# Patient Record
Sex: Female | Born: 1970 | Race: White | Hispanic: No | Marital: Married | Smoking: Never smoker
Health system: Southern US, Community
[De-identification: ages and names within clinical notes are randomized; demographics above are authoritative.]

## PROBLEM LIST (undated history)

## (undated) DIAGNOSIS — G43909 Migraine, unspecified, not intractable, without status migrainosus: Secondary | ICD-10-CM

## (undated) DIAGNOSIS — E119 Type 2 diabetes mellitus without complications: Secondary | ICD-10-CM

## (undated) DIAGNOSIS — E039 Hypothyroidism, unspecified: Secondary | ICD-10-CM

## (undated) DIAGNOSIS — E049 Nontoxic goiter, unspecified: Secondary | ICD-10-CM

## (undated) HISTORY — DX: Migraine, unspecified, not intractable, without status migrainosus: G43.909

## (undated) HISTORY — DX: Nontoxic goiter, unspecified: E04.9

## (undated) HISTORY — DX: Type 2 diabetes mellitus without complications: E11.9

---
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
## 1998-11-02 ENCOUNTER — Other Ambulatory Visit: Admission: RE | Admit: 1998-11-02 | Discharge: 1998-11-02 | Payer: Self-pay | Admitting: Obstetrics and Gynecology

## 1999-12-05 ENCOUNTER — Other Ambulatory Visit: Admission: RE | Admit: 1999-12-05 | Discharge: 1999-12-05 | Payer: Self-pay | Admitting: Obstetrics and Gynecology

## 2001-01-28 ENCOUNTER — Other Ambulatory Visit: Admission: RE | Admit: 2001-01-28 | Discharge: 2001-01-28 | Payer: Self-pay | Admitting: Obstetrics and Gynecology

## 2001-02-08 ENCOUNTER — Encounter: Payer: Self-pay | Admitting: Endocrinology

## 2001-02-08 ENCOUNTER — Encounter: Admission: RE | Admit: 2001-02-08 | Discharge: 2001-02-08 | Payer: Self-pay | Admitting: Endocrinology

## 2002-03-14 ENCOUNTER — Other Ambulatory Visit: Admission: RE | Admit: 2002-03-14 | Discharge: 2002-03-14 | Payer: Self-pay | Admitting: Obstetrics and Gynecology

## 2002-06-27 ENCOUNTER — Encounter: Admission: RE | Admit: 2002-06-27 | Discharge: 2002-06-27 | Payer: Self-pay | Admitting: Internal Medicine

## 2002-06-27 ENCOUNTER — Encounter: Payer: Self-pay | Admitting: Internal Medicine

## 2006-04-06 ENCOUNTER — Encounter: Admission: RE | Admit: 2006-04-06 | Discharge: 2006-04-06 | Payer: Self-pay | Admitting: Internal Medicine

## 2007-01-20 ENCOUNTER — Encounter: Admission: RE | Admit: 2007-01-20 | Discharge: 2007-01-20 | Payer: Self-pay | Admitting: Obstetrics and Gynecology

## 2008-03-25 IMAGING — US US SOFT TISSUE HEAD/NECK
1 series · 13 of 25 positions shown · non-contrast
Comparison: 06/27/02.

CLINICAL DATA: Hypothyroidism and goiter.  Enlarged thyroid gland based on physical examination.  
THYROID ULTRASOUND:
TECHNIQUE: Ultrasound examination of the thyroid gland and adjacent soft tissue structures was performed.

[Series 1: us soft tissue head/neck · 0.08mm/px · 13 of 39 slices shown]
[im 1/39]
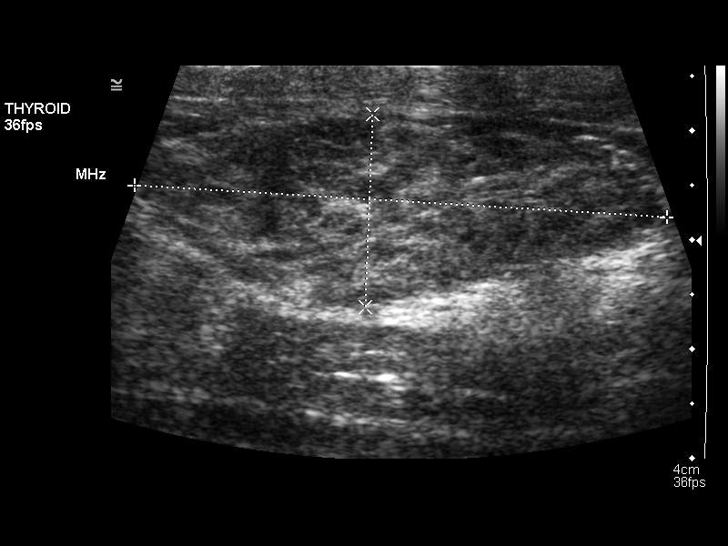
[im 4/39]
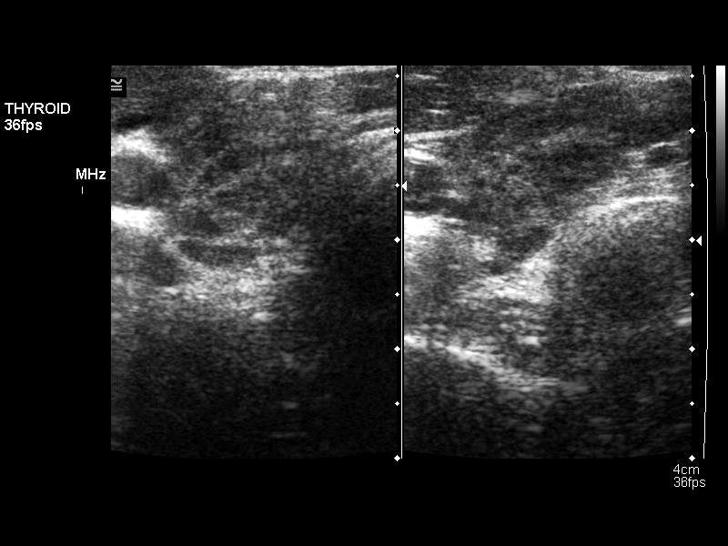
[im 7/39]
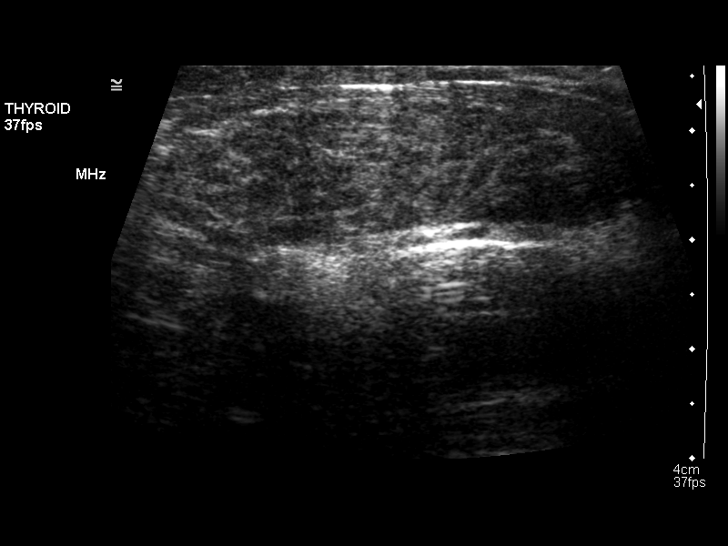
[im 10/39]
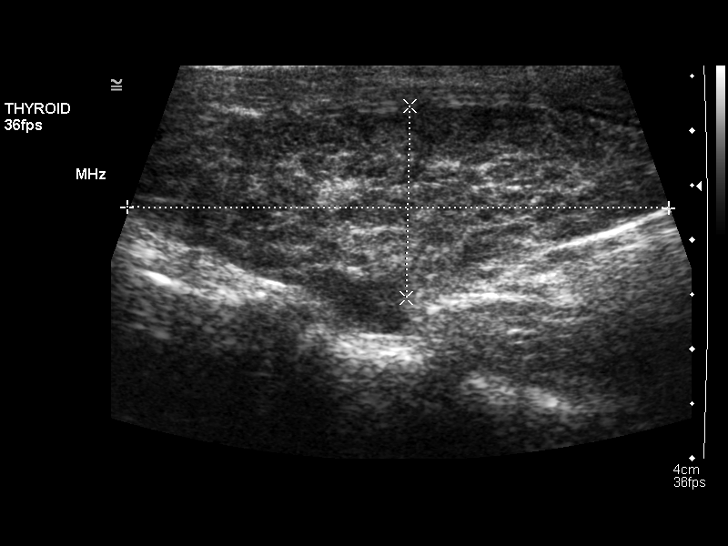
[im 13/39]
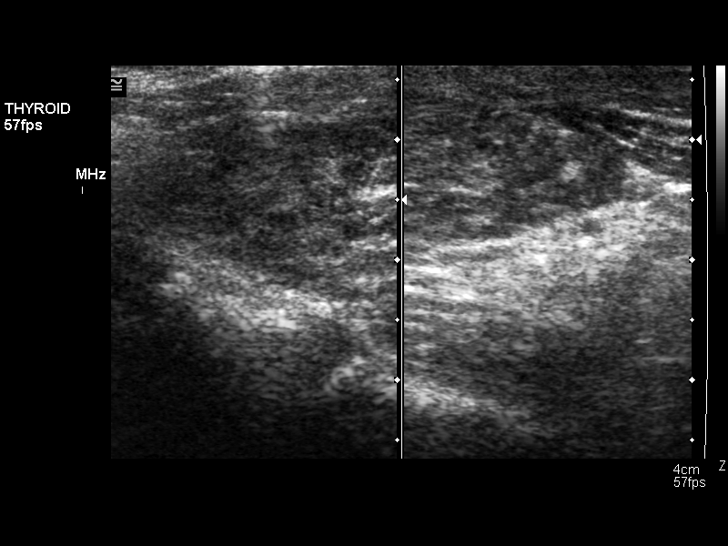
[im 16/39]
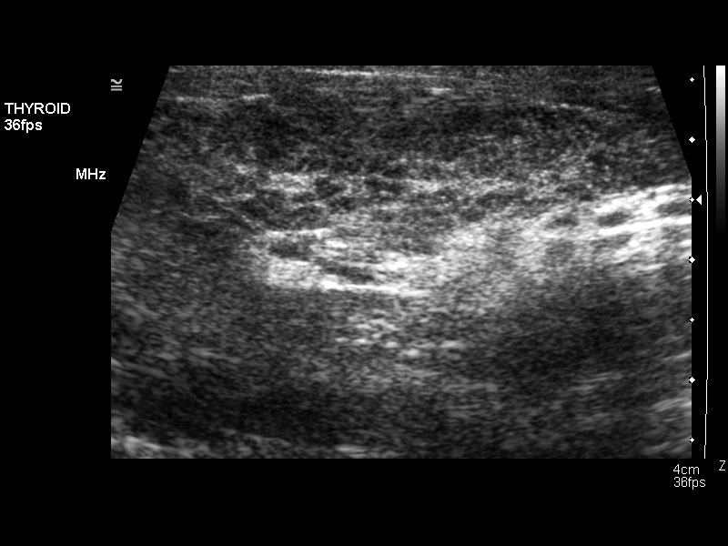
[im 20/39]
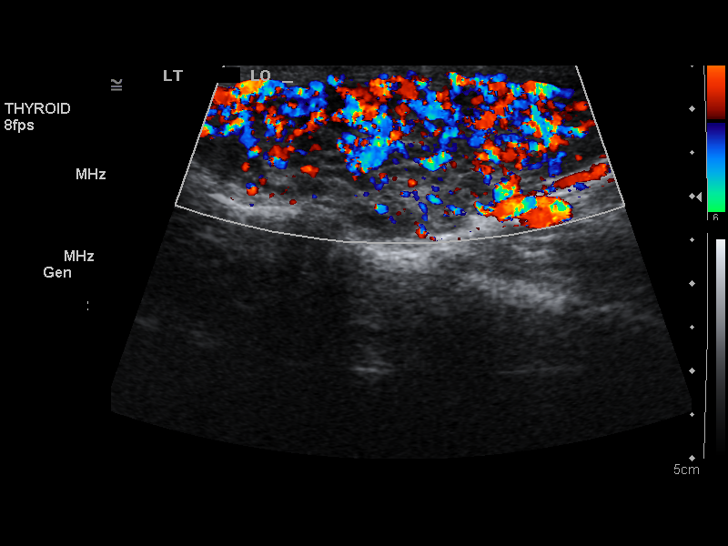
[im 23/39]
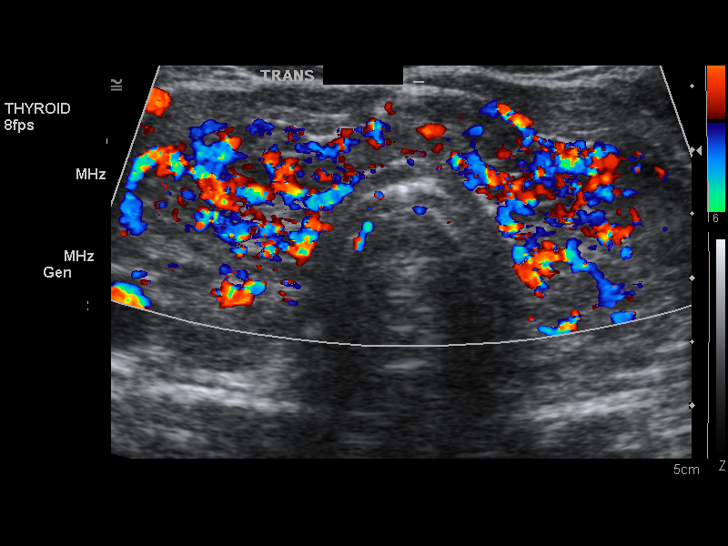
[im 26/39]
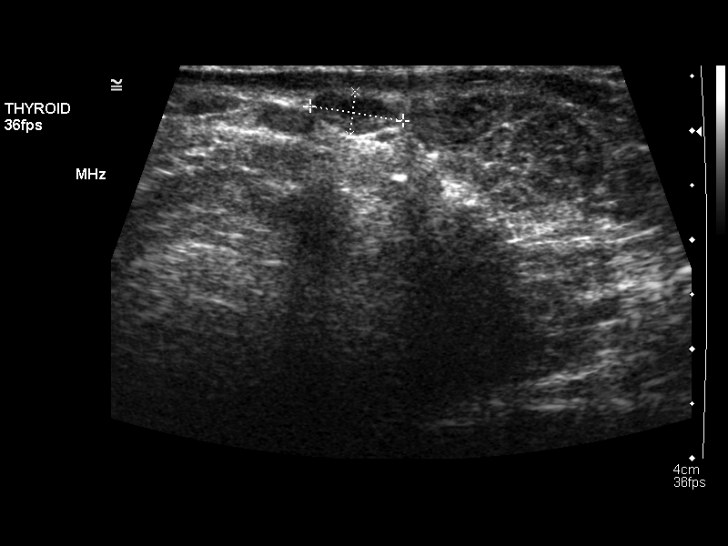
[im 29/39]
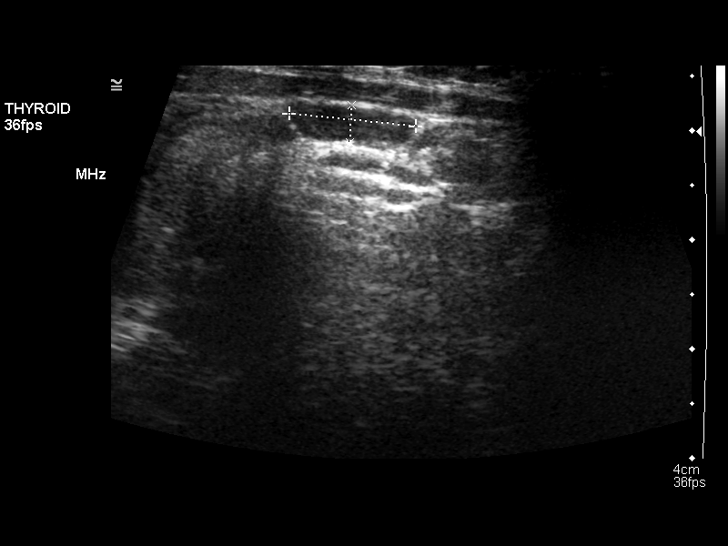
[im 32/39]
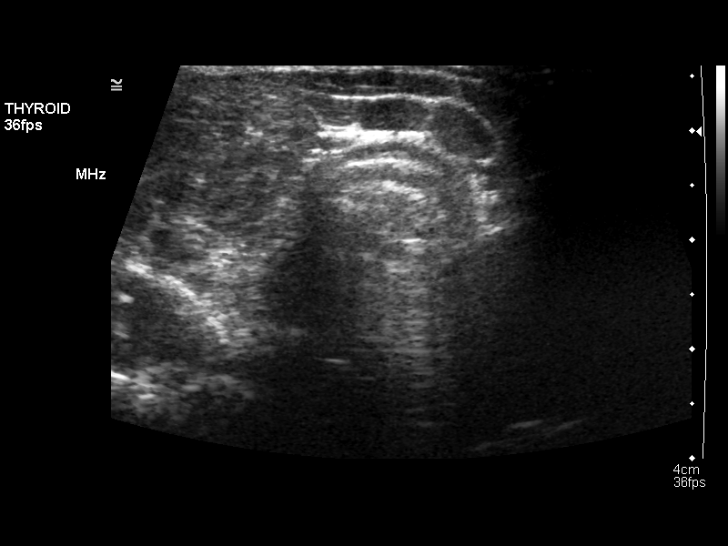
[im 35/39]
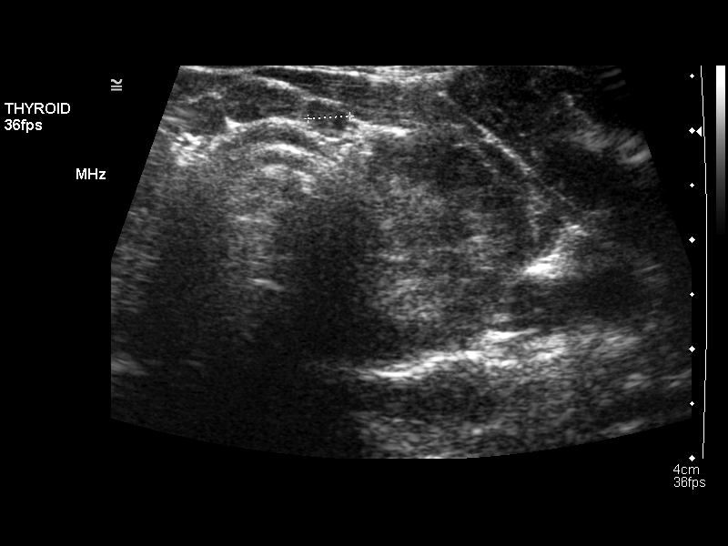
[im 39/39]
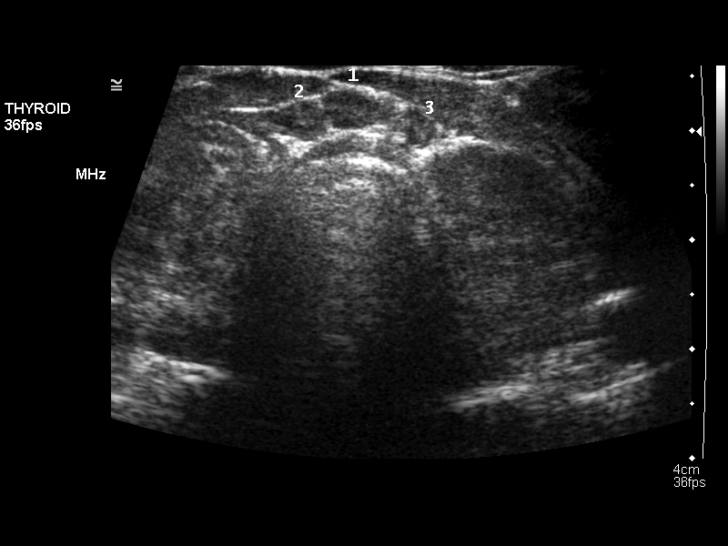

[13 of 25 positions shown; findings below may reference images not displayed]

FINDINGS: Both thyroid glands have a heterogeneous echogenicity pattern.  This morphology is similar to the prior examination.  The right gland measures 4.9 x 1.8 x 1.8cm.  Increased vascular flow throughout the right gland is similar to the prior examination.  The left thyroid gland measures 5.3 x 1.8 x 1.4cm.  The isthmus roughly measures .7cm in thickness.  Diffusely increased vascularity throughout the left thyroid gland is also similar to the prior examination.  Just above the isthmus, there are at least three small hypoechoic oval shaped structures.  The largest measures up to 9mm.  These could represent small lymph nodes but the etiology is uncertain.  Please note that one of these structures may measure up to 1.2cm.  Within the left lobe there are a few areas of increased echogenicity.  These do not confidently represent thyroid nodules and may be related to the heterogeneous appearance of the gland.
IMPRESSION: 1.  Stable appearance of the thyroid tissue with diffuse heterogeneity and increased blood flow.  Findings consistent with underlying thyroiditis.  No dominant nodules or lesions within either lobe. 
2.  At least three oval shaped structures separate from the thyroid gland just above the isthmus.  Findings most consistent with small lymph nodes that may be reactive in nature.

## 2008-05-10 ENCOUNTER — Encounter: Admission: RE | Admit: 2008-05-10 | Discharge: 2008-05-10 | Payer: Self-pay | Admitting: Obstetrics and Gynecology

## 2008-07-24 ENCOUNTER — Encounter (INDEPENDENT_AMBULATORY_CARE_PROVIDER_SITE_OTHER): Payer: Self-pay | Admitting: Obstetrics and Gynecology

## 2008-07-24 ENCOUNTER — Inpatient Hospital Stay (HOSPITAL_COMMUNITY): Admission: RE | Admit: 2008-07-24 | Discharge: 2008-07-27 | Payer: Self-pay | Admitting: Obstetrics and Gynecology

## 2008-08-02 ENCOUNTER — Ambulatory Visit: Admission: RE | Admit: 2008-08-02 | Discharge: 2008-08-02 | Payer: Self-pay | Admitting: Obstetrics and Gynecology

## 2010-01-27 ENCOUNTER — Encounter: Payer: Self-pay | Admitting: Obstetrics and Gynecology

## 2010-03-05 LAB — GC/CHLAMYDIA PROBE AMP, GENITAL: Chlamydia: NEGATIVE

## 2010-03-05 LAB — RUBELLA ANTIBODY, IGM: Rubella: IMMUNE

## 2010-03-05 LAB — ANTIBODY SCREEN: Antibody Screen: NEGATIVE

## 2010-03-05 LAB — HEPATITIS B SURFACE ANTIGEN: Hepatitis B Surface Ag: NEGATIVE

## 2010-04-14 LAB — COMPREHENSIVE METABOLIC PANEL
Albumin: 2 g/dL — ABNORMAL LOW (ref 3.5–5.2)
BUN: 3 mg/dL — ABNORMAL LOW (ref 6–23)
Creatinine, Ser: 0.64 mg/dL (ref 0.4–1.2)
Glucose, Bld: 94 mg/dL (ref 70–99)
Total Bilirubin: 0.3 mg/dL (ref 0.3–1.2)
Total Protein: 4.1 g/dL — ABNORMAL LOW (ref 6.0–8.3)

## 2010-04-14 LAB — CBC
HCT: 29 % — ABNORMAL LOW (ref 36.0–46.0)
HCT: 32.2 % — ABNORMAL LOW (ref 36.0–46.0)
HCT: 38.5 % (ref 36.0–46.0)
Hemoglobin: 11.1 g/dL — ABNORMAL LOW (ref 12.0–15.0)
Hemoglobin: 13.3 g/dL (ref 12.0–15.0)
MCHC: 34.5 g/dL (ref 30.0–36.0)
MCHC: 34.6 g/dL (ref 30.0–36.0)
MCV: 93 fL (ref 78.0–100.0)
MCV: 95 fL (ref 78.0–100.0)
MCV: 96.4 fL (ref 78.0–100.0)
Platelets: 227 10*3/uL (ref 150–400)
Platelets: 242 10*3/uL (ref 150–400)
RBC: 4.05 MIL/uL (ref 3.87–5.11)
RDW: 13.9 % (ref 11.5–15.5)
RDW: 14.1 % (ref 11.5–15.5)
RDW: 14.4 % (ref 11.5–15.5)
WBC: 12 10*3/uL — ABNORMAL HIGH (ref 4.0–10.5)

## 2010-04-14 LAB — DIFFERENTIAL
Band Neutrophils: 0 % (ref 0–10)
Basophils Relative: 0 % (ref 0–1)
Blasts: 0 %
Lymphocytes Relative: 11 % — ABNORMAL LOW (ref 12–46)
Lymphs Abs: 1.5 10*3/uL (ref 0.7–4.0)
Monocytes Absolute: 0.7 10*3/uL (ref 0.1–1.0)
Monocytes Relative: 5 % (ref 3–12)
Neutro Abs: 10.9 10*3/uL — ABNORMAL HIGH (ref 1.7–7.7)
Neutrophils Relative %: 82 % — ABNORMAL HIGH (ref 43–77)

## 2010-04-14 LAB — URIC ACID: Uric Acid, Serum: 3.1 mg/dL (ref 2.4–7.0)

## 2010-04-14 LAB — RPR: RPR Ser Ql: NONREACTIVE

## 2010-05-21 NOTE — Op Note (Signed)
NAME:  Monica Nichols, Monica Nichols                  ACCOUNT NO.:  [id number]   MEDICAL RECORD NO.:  [id number]          PATIENT TYPE:  INP   LOCATION:  9101                          FACILITY:  [facility]   PHYSICIAN:  Guy Sandifer. Henderson Cloud, M.D. DATE OF BIRTH:  Jan 10, 1970   DATE OF PROCEDURE:  07/24/2008  DATE OF DISCHARGE:                               OPERATIVE REPORT   PREOPERATIVE DIAGNOSIS:  Arrest of descent.   POSTOPERATIVE DIAGNOSIS:  Arrest of descent.   PROCEDURE:  Low-transverse cesarean section.   SURGEON:  Guy Sandifer. Henderson Cloud, MD   ANESTHESIA:  Epidural, Dana Allan, MD   SPECIMENS:  Placenta to pathology.   FINDINGS:  Viable female infant, Apgars of 9 and 9 at one and five minutes  respectively.  Birth weight pending.  Arterial cord pH 7.22.   ESTIMATED BLOOD LOSS:  800 mL.   INDICATIONS AND CONSENT:  This patient is a 40 year old married white  female G1 P0 with an EDC of July 24, 2008, placing her 14-0/7th weeks.  Prenatal care has been complicated by advanced maternal age.  The  patient declined screens.  She has had gestational diabetes with good  diet control.  Estimated fetal weight was 8 pounds 1 ounce on July 24, 2008.  She has also had hypothyroidism and group B strep culture is  negative.  She presents for induction of labor with a favorable cervix.  She is 4-5 cm dilated, 80% effaced, 0 station, and vertex.  Artificial  rupture of her bag of waters for clear fluid was undertaken.  She  progresses to complete and pushing.  She pushes for approximately 2-1/2  hours with no progress over the last 1-1/2 hours.  Vertex is at the +1  station.  Diagnosis of arrest of descent was made.  Recommendation for  cesarean section was made.  Potential risks and complications were  discussed with the patient including, but not limited to infection,  organ damage, bleeding requiring transfusion of blood products with HIV  and hepatitis acquisition, DVT, PE, pneumonia.  All questions were  answered  and consent was signed on the chart.   PROCEDURE IN DETAIL:  The patient was taken to operating room where she  was identified.  Epidural anesthetic was augmented to surgical level.  Foley catheter was already in place and she was draped in sterile  fashion.  After testing for adequate anesthesia, skin was entered  through a Pfannenstiel incision.  While dissecting down toward the  fascia, she begins to have some discomfort on the left side.  Approximately 10 cc of 1% Xylocaine was then instilled into the edges of  the adipose of the incision, as well as subfascially.  This relieves the  patient's discomfort.  Fascia was entered and dissection was carried  onto the peritoneum, which was entered and extended superiorly and  inferiorly.  The remaining 10 cc of 1% plain Xylocaine were then poured  into the peritoneal cavity.  The vesicouterine peritoneum was then taken  down cephalad laterally.  The bladder flap was developed and bladder  blade was placed.  Uterus  was incised in a low transverse manner.  The  uterine cavity was entered bluntly with a hemostat.  Uterine incision  was extended cephalad laterally with fingers.  The vertex was then  elevated, flexed and delivered through the incision.  Nuchal cord times  x1 was reduced.  Oronasal pharynx were suctioned.  The remainder of the  baby was then delivered and good cry and tone was noted.  Cord was  clamped and cut and the baby was handed to awaiting pediatrics team.  Placenta was manually delivered and sent to pathology.  Uterine cavity  was cleaned.  Uterus was closed in 2 running locking imbricating layers  of 0 Monocryl suture, which achieves good hemostasis.  Tubes and ovaries  are normal bilaterally.  Anterior peritoneum was then closed in running  fashion with 0 Monocryl suture, which was also used to reapproximate the  Cisco muscle in the midline.  Anterior rectus fascia was closed in  running fashion with the double  looped 0 PDS suture.  Skin was closed  with clips.  All sponge, instrument, and needle counts were correct and  the patient was transferred to recovery room in stable condition.      Guy Sandifer Henderson Cloud, M.D.  Electronically Signed     JET/MEDQ  D:  07/24/2008  T:  07/25/2008  Job:  [id number]

## 2010-05-24 NOTE — Discharge Summary (Signed)
NAME:  Monica Nichols, Monica Nichols                  ACCOUNT NO.:  [id number]   MEDICAL RECORD NO.:  [id number]          PATIENT TYPE:  INP   LOCATION:  9101                          FACILITY:  [facility]   PHYSICIAN:  Dineen Kid. Rana Snare, M.D.    DATE OF BIRTH:  Dec 18, 1970   DATE OF ADMISSION:  07/24/2008  DATE OF DISCHARGE:  07/27/2008                               DISCHARGE SUMMARY   ADMITTING DIAGNOSES:  1. Intrauterine pregnancy at term.  2. Gestational diabetes.  3. Induction of labor with favorable cervix.   DISCHARGE DIAGNOSES:  1. Status post low transverse cesarean section secondary to arrest of      descent.  2. Viable female infant.   PROCEDURE:  Primary low transverse cesarean section.   REASON FOR ADMISSION:  Please see written H and P.   HOSPITAL COURSE:  The patient a 40 year old white married female,  primigravida that was admitted to [facility] at 40  weeks' estimated gestational age for induction of labor.  The patient's  prenatal care had been complicated by advanced maternal age and  gestational diabetes with good diet control.  Estimated fetal weight was  8 pounds and 1 ounce.  The patient also had known hypothyroidism and  group B beta strep culture had been negative.  The patient was now  admitted for induction of labor with a favorable cervix.  On admission,  vital signs were stable with cervix dilated to 4-5 cm, 80% effaced,  vertex at a 0 station.  Artificial rupture of membranes was performed,  which revealed clear fluid.  The patient did progress to complete  dilatation and pushed for approximately 2-1/2 hours.  Vertex remained at  a +1 station.  Decision was made to proceed with a primary low  transverse cesarean section for failure to progress.  Epidural was dosed  to an adequate surgical level and a low transverse incision was made  with delivery of a viable female infant with Apgars of 9 at 1 and 9 at 5  minutes.  Arterial cord pH of 7.22.  The patient tolerated  the procedure  well and taken to the recovery room in stable condition.  On  postoperative day #1, the patient was without complaint.  Vital signs  were stable.  Abdomen was soft with decrease in bowel sounds.  Fundus  was firm and nontender.  Abdominal dressing noted to be clean, dry, and  intact.  Foley was noted to have some decrease in urine output.  Laboratory findings showed hemoglobin of 11.1, platelet count of  241,000, and WBC of 21.5.  IV fluid bolus was given and fasting blood  sugar was scheduled for the following morning.  On postoperative day #2,  the patient was without complaint.  Vital signs were stable.  She was  afebrile.  Fundus was firm and nontender.  Abdominal dressing noted to  be clean, dry, and intact.  Foley had been discontinued.  The patient  was now voiding well.  Fasting blood sugar was 93 mg/dL.  On  postoperative day #3, the patient denied headache.  She had  experienced  some slight blurred vision with reading, no right upper quadrant pain  was noted.  Vital signs were stable.  Blood pressure was 123/76.  Deep  tendon reflexes are 1-2 plus, no clonus.  Abdomen soft, no right upper  quadrant tenderness was noted.  Incision was clean, dry, and intact, and  staples were removed.  Laboratory findings showed hemoglobin of 10.1,  platelet count of 227,000, AST was 35, and ALT was 20.  Uric acid was  3.1.  Discharge instructions reviewed and the patient was later  discharged to home.   CONDITION ON DISCHARGE:  Stable.   DIET:  Regular as tolerated.   ACTIVITY:  No heavy lifting, no driving x2 weeks, no vaginal entry.   FOLLOWUP:  The patient to follow up in the office 3-5 days for blood  pressure check.  She is to call for temperature greater than 100  degrees, persistent nausea, vomiting, heavy vaginal bleeding, and/or  redness or drainage from incisional site.   DISCHARGE MEDICATIONS:  1. Tylox #30 one p.o. every 4-6 hours p.r.n.  2. Motrin 600 mg every  6 hours.  3. Prenatal vitamins one p.o. daily.  4. Colace one p.o. daily p.r.n.      Julio Sicks, N.P.      Dineen Kid Rana Snare, M.D.  Electronically Signed    CC/MEDQ  D:  08/04/2008  T:  08/05/2008  Job:  [id number]

## 2010-09-25 ENCOUNTER — Encounter (HOSPITAL_COMMUNITY): Payer: Self-pay

## 2010-09-25 ENCOUNTER — Inpatient Hospital Stay (HOSPITAL_COMMUNITY)
Admission: AD | Admit: 2010-09-25 | Discharge: 2010-09-28 | DRG: 371 | Disposition: A | Discharge status: Home / Self Care | Payer: BC Managed Care – PPO | Source: Ambulatory Visit | Attending: Obstetrics and Gynecology | Admitting: Obstetrics and Gynecology

## 2010-09-25 DIAGNOSIS — Z302 Encounter for sterilization: Secondary | ICD-10-CM

## 2010-09-25 DIAGNOSIS — O09529 Supervision of elderly multigravida, unspecified trimester: Secondary | ICD-10-CM | POA: Diagnosis present

## 2010-09-25 DIAGNOSIS — O34219 Maternal care for unspecified type scar from previous cesarean delivery: Secondary | ICD-10-CM | POA: Diagnosis present

## 2010-09-25 DIAGNOSIS — O324XX0 Maternal care for high head at term, not applicable or unspecified: Secondary | ICD-10-CM | POA: Diagnosis present

## 2010-09-25 HISTORY — DX: Hypothyroidism, unspecified: E03.9

## 2010-09-25 LAB — CBC
Hemoglobin: 13.4 g/dL (ref 12.0–15.0)
MCHC: 34.1 g/dL (ref 30.0–36.0)

## 2010-09-25 LAB — RPR: RPR: NONREACTIVE

## 2010-09-25 MED ORDER — LEVOTHYROXINE SODIUM 100 MCG PO TABS: 100.0000 ug | ORAL_TABLET | Freq: Every day | ORAL | Status: DC

## 2010-09-25 MED ORDER — LEVOTHYROXINE SODIUM 100 MCG PO TABS
100.0000 ug | ORAL_TABLET | Freq: Every day | ORAL | Status: DC
  Filled 2010-09-25: qty 1

## 2010-09-25 MED ORDER — OXYTOCIN BOLUS FROM INFUSION
500.0000 mL | Freq: Once | INTRAVENOUS | Status: DC
  Filled 2010-09-25: qty 500

## 2010-09-25 MED ORDER — FLEET ENEMA 7-19 GM/118ML RE ENEM: 1.0000 | ENEMA | RECTAL | Status: DC | PRN

## 2010-09-25 MED ORDER — IBUPROFEN 600 MG PO TABS: 600.0000 mg | ORAL_TABLET | Freq: Four times a day (QID) | ORAL | Status: DC | PRN

## 2010-09-25 MED ORDER — ONDANSETRON HCL 4 MG/2ML IJ SOLN: 4.0000 mg | Freq: Four times a day (QID) | INTRAMUSCULAR | Status: DC | PRN

## 2010-09-25 MED ORDER — LACTATED RINGERS IV SOLN: 500.0000 mL | INTRAVENOUS | Status: DC | PRN

## 2010-09-25 MED ORDER — LACTATED RINGERS IV SOLN
INTRAVENOUS | Status: DC
  Administered 2010-09-25 (×2): via INTRAVENOUS
  Administered 2010-09-26 (×3): 125 mL/h via INTRAVENOUS

## 2010-09-25 MED ORDER — OXYCODONE-ACETAMINOPHEN 5-325 MG PO TABS: 2.0000 | ORAL_TABLET | ORAL | Status: DC | PRN

## 2010-09-25 MED ORDER — LIDOCAINE HCL (PF) 1 % IJ SOLN
30.0000 mL | INTRAMUSCULAR | Status: DC | PRN
  Filled 2010-09-25 (×2): qty 30

## 2010-09-25 MED ORDER — OXYTOCIN 20 UNITS IN LACTATED RINGERS INFUSION - SIMPLE: 125.0000 mL/h | Freq: Once | INTRAVENOUS | Status: DC

## 2010-09-25 MED ORDER — ACETAMINOPHEN 325 MG PO TABS: 650.0000 mg | ORAL_TABLET | ORAL | Status: DC | PRN

## 2010-09-25 MED ORDER — LEVOTHYROXINE SODIUM 100 MCG PO TABS
100.0000 ug | ORAL_TABLET | Freq: Every day | ORAL | Status: DC
  Administered 2010-09-26 – 2010-09-28 (×3): 100 ug via ORAL
  Filled 2010-09-25 (×4): qty 1

## 2010-09-25 MED ORDER — CITRIC ACID-SODIUM CITRATE 334-500 MG/5ML PO SOLN
30.0000 mL | ORAL | Status: DC | PRN
  Administered 2010-09-26: 30 mL via ORAL
  Filled 2010-09-25: qty 15

## 2010-09-25 NOTE — Progress Notes (Signed)
Pt states ctx's q5-6 minutes apart x1hr , denies bleeding or lof, +FM, was 4cm/100% effaced.

## 2010-09-25 NOTE — Progress Notes (Signed)
Dr. Rana Snare notified of pt presenting for labor check. Notified of VE 4cm in office today with change to 5cm now.  Notified of previous c/s and GBS negative. Admit orders received.

## 2010-09-25 NOTE — Progress Notes (Signed)
Pt may go to room 161.  Will call when ready for pt to come.

## 2010-09-25 NOTE — Progress Notes (Signed)
Labs drawn from IV site

## 2010-09-25 NOTE — H&P (Signed)
Monica Nichols is a 40 y.o. female presenting for labor evaluation.  Triage said active labor at 5 cm so she was admitted to L&D.  Pt was seen in office today by Dr Vincente Poli..  Pregnancy complicated by previous LSTCS for FTP 6 +12 and wants TOL Maternal Medical History:  Reason for admission: Reason for admission: contractions.  Contractions: Onset was 3-5 hours ago.   Frequency: regular.   Perceived severity is moderate.    Fetal activity: Perceived fetal activity is normal.      OB History    Grav Para Term Preterm Abortions TAB SAB Ect Mult Living   2 1 1  0 0 0 0 0 0 1     Past Medical History  Diagnosis Date  . Hypothyroid    Past Surgical History  Procedure Date  . Cesarean section    Family History: family history is not on file. Social History:  reports that she has never smoked. She does not have any smokeless tobacco history on file. She reports that she does not drink alcohol or use illicit drugs.  ROS  Dilation: 5 Effacement (%): 100 Station: 0;-1 Exam by:: a. white rn Blood pressure 118/67, pulse 91, temperature 98.7 F (37.1 C), temperature source Axillary, resp. rate 18, height 5\' 2"  (1.575 m), weight 70.364 kg (155 lb 2 oz). Exam Physical Exam  Prenatal labs: ABO, Rh: A/Positive/-- (02/28 0000) Antibody: Negative (02/28 0000) Rubella:   RPR: Nonreactive (09/19 0000)  HBsAg: Negative (02/28 0000)  HIV: Non-reactive (02/28 0000)  GBS: Negative (09/13 0000)   Assessment/Plan: IUP at term in active labor. Prev LSCTCS for TOL.  Discussed R&B.  Informed consent obtained. Pt declines any intervention at this time.  She wants to walk, and does not want AROM or pitocin.   Dal Blew C 09/25/2010, 11:06 PM

## 2010-09-25 NOTE — Progress Notes (Signed)
Pt presents to [facility] for labor check. efm and toco placed. Assessing.  Pt denies bleeding or leaking.

## 2010-09-25 NOTE — Progress Notes (Signed)
Called Dr. Rana Snare on cell phone.  No answer. Left message to call back.

## 2010-09-26 ENCOUNTER — Encounter (HOSPITAL_COMMUNITY): Payer: Self-pay | Admitting: Anesthesiology

## 2010-09-26 ENCOUNTER — Encounter (HOSPITAL_COMMUNITY): Payer: Self-pay | Admitting: *Deleted

## 2010-09-26 ENCOUNTER — Encounter (HOSPITAL_COMMUNITY)
Admission: AD | Disposition: A | Discharge status: Home / Self Care | Payer: Self-pay | Source: Ambulatory Visit | Attending: Obstetrics and Gynecology

## 2010-09-26 ENCOUNTER — Inpatient Hospital Stay (HOSPITAL_COMMUNITY): Payer: BC Managed Care – PPO | Admitting: Anesthesiology

## 2010-09-26 HISTORY — PX: TUBAL LIGATION: SHX77

## 2010-09-26 LAB — RPR: RPR Ser Ql: NONREACTIVE

## 2010-09-26 SURGERY — Surgical Case
Anesthesia: Regional

## 2010-09-26 MED ORDER — DIPHENHYDRAMINE HCL 50 MG/ML IJ SOLN: 12.5000 mg | INTRAMUSCULAR | Status: DC | PRN

## 2010-09-26 MED ORDER — KETOROLAC TROMETHAMINE 60 MG/2ML IM SOLN
INTRAMUSCULAR | Status: AC
  Administered 2010-09-26: 60 mg via INTRAMUSCULAR
  Filled 2010-09-26: qty 2

## 2010-09-26 MED ORDER — OXYTOCIN 20 UNITS IN LACTATED RINGERS INFUSION - SIMPLE
INTRAVENOUS | Status: AC
  Filled 2010-09-26: qty 1000

## 2010-09-26 MED ORDER — SODIUM BICARBONATE 8.4 % IV SOLN
INTRAVENOUS | Status: DC | PRN
  Administered 2010-09-26: 10 mL via EPIDURAL

## 2010-09-26 MED ORDER — MORPHINE SULFATE (PF) 0.5 MG/ML IJ SOLN
INTRAMUSCULAR | Status: DC | PRN
  Administered 2010-09-26: 4 mg via EPIDURAL

## 2010-09-26 MED ORDER — MORPHINE SULFATE 0.5 MG/ML IJ SOLN
INTRAMUSCULAR | Status: AC
  Filled 2010-09-26: qty 10

## 2010-09-26 MED ORDER — SCOPOLAMINE 1 MG/3DAYS TD PT72
MEDICATED_PATCH | TRANSDERMAL | Status: AC
  Filled 2010-09-26: qty 1

## 2010-09-26 MED ORDER — MEPERIDINE HCL 25 MG/ML IJ SOLN: 6.2500 mg | INTRAMUSCULAR | Status: DC | PRN

## 2010-09-26 MED ORDER — SCOPOLAMINE 1 MG/3DAYS TD PT72
1.0000 | MEDICATED_PATCH | Freq: Once | TRANSDERMAL | Status: DC
  Administered 2010-09-26: 1.5 mg via TRANSDERMAL

## 2010-09-26 MED ORDER — KETOROLAC TROMETHAMINE 60 MG/2ML IM SOLN
60.0000 mg | Freq: Once | INTRAMUSCULAR | Status: AC | PRN
  Administered 2010-09-26: 60 mg via INTRAMUSCULAR

## 2010-09-26 MED ORDER — FENTANYL 2.5 MCG/ML BUPIVACAINE 1/10 % EPIDURAL INFUSION (WH - ANES)
INTRAMUSCULAR | Status: DC | PRN
  Administered 2010-09-26: 14 mL/h via EPIDURAL

## 2010-09-26 MED ORDER — EPHEDRINE 5 MG/ML INJ
10.0000 mg | INTRAVENOUS | Status: DC | PRN
  Administered 2010-09-26: 10 mg via INTRAVENOUS
  Filled 2010-09-26 (×3): qty 4

## 2010-09-26 MED ORDER — EPHEDRINE 5 MG/ML INJ
10.0000 mg | INTRAVENOUS | Status: DC | PRN
  Filled 2010-09-26: qty 4

## 2010-09-26 MED ORDER — OXYTOCIN 20 UNITS IN LACTATED RINGERS INFUSION - SIMPLE
125.0000 mL/h | INTRAVENOUS | Status: AC
  Administered 2010-09-26: 125 mL/h via INTRAVENOUS

## 2010-09-26 MED ORDER — LACTATED RINGERS IV SOLN
500.0000 mL | Freq: Once | INTRAVENOUS | Status: AC
  Administered 2010-09-26: 1000 mL via INTRAVENOUS

## 2010-09-26 MED ORDER — ONDANSETRON HCL 4 MG/2ML IJ SOLN
INTRAMUSCULAR | Status: DC | PRN
  Administered 2010-09-26: 4 mg via INTRAVENOUS

## 2010-09-26 MED ORDER — FENTANYL 2.5 MCG/ML BUPIVACAINE 1/10 % EPIDURAL INFUSION (WH - ANES)
14.0000 mL/h | INTRAMUSCULAR | Status: DC
  Administered 2010-09-26: 14 mL/h via EPIDURAL
  Filled 2010-09-26 (×2): qty 60

## 2010-09-26 MED ORDER — OXYTOCIN 10 UNIT/ML IJ SOLN
INTRAMUSCULAR | Status: AC
  Filled 2010-09-26: qty 2

## 2010-09-26 MED ORDER — LACTATED RINGERS IV SOLN
INTRAVENOUS | Status: DC | PRN
  Administered 2010-09-26: 20:00:00 via INTRAVENOUS

## 2010-09-26 MED ORDER — TERBUTALINE SULFATE 1 MG/ML IJ SOLN: 0.2500 mg | Freq: Once | INTRAMUSCULAR | Status: AC | PRN

## 2010-09-26 MED ORDER — MEPERIDINE HCL 25 MG/ML IJ SOLN
INTRAMUSCULAR | Status: DC | PRN
  Administered 2010-09-26: 25 mg via INTRAVENOUS

## 2010-09-26 MED ORDER — MEPERIDINE HCL 25 MG/ML IJ SOLN
INTRAMUSCULAR | Status: AC
  Filled 2010-09-26: qty 1

## 2010-09-26 MED ORDER — LIDOCAINE HCL 1.5 % IJ SOLN
INTRAMUSCULAR | Status: DC | PRN
  Administered 2010-09-26 (×2): 5 mL via EPIDURAL

## 2010-09-26 MED ORDER — DROPERIDOL 2.5 MG/ML IJ SOLN
INTRAMUSCULAR | Status: AC
  Filled 2010-09-26: qty 2

## 2010-09-26 MED ORDER — OXYTOCIN 10 UNIT/ML IJ SOLN
INTRAMUSCULAR | Status: DC | PRN
  Administered 2010-09-26: 20 [IU] via INTRAMUSCULAR

## 2010-09-26 MED ORDER — GENTAMICIN SULFATE 40 MG/ML IJ SOLN
INTRAVENOUS | Status: DC
  Filled 2010-09-26: qty 2.5

## 2010-09-26 MED ORDER — PHENYLEPHRINE 40 MCG/ML (10ML) SYRINGE FOR IV PUSH (FOR BLOOD PRESSURE SUPPORT)
80.0000 ug | PREFILLED_SYRINGE | INTRAVENOUS | Status: DC | PRN
  Filled 2010-09-26: qty 5

## 2010-09-26 MED ORDER — MORPHINE SULFATE (PF) 0.5 MG/ML IJ SOLN
INTRAMUSCULAR | Status: DC | PRN
  Administered 2010-09-26: 1 mg via INTRAVENOUS

## 2010-09-26 MED ORDER — PHENYLEPHRINE 40 MCG/ML (10ML) SYRINGE FOR IV PUSH (FOR BLOOD PRESSURE SUPPORT)
80.0000 ug | PREFILLED_SYRINGE | INTRAVENOUS | Status: DC | PRN
  Filled 2010-09-26 (×2): qty 5

## 2010-09-26 MED ORDER — CLINDAMYCIN PHOSPHATE 600 MG/50ML IV SOLN: INTRAVENOUS | Status: DC | PRN

## 2010-09-26 MED ORDER — OXYTOCIN 20 UNITS IN LACTATED RINGERS INFUSION - SIMPLE
1.0000 m[IU]/min | INTRAVENOUS | Status: DC
  Administered 2010-09-26: 1 m[IU]/min via INTRAVENOUS
  Filled 2010-09-26: qty 1000

## 2010-09-26 MED ORDER — GENTAMICIN SULFATE 40 MG/ML IJ SOLN
INTRAVENOUS | Status: DC | PRN
  Administered 2010-09-26: 100 mL via INTRAVENOUS

## 2010-09-26 SURGICAL SUPPLY — 26 items
CLIP FILSHIE TUBAL LIGA STRL (Clip) ×1 IMPLANT
CLOTH BEACON ORANGE TIMEOUT ST (SAFETY) ×3 IMPLANT
DRESSING TELFA 8X3 (GAUZE/BANDAGES/DRESSINGS) IMPLANT
DRSG COVADERM 4X10 (GAUZE/BANDAGES/DRESSINGS) ×1 IMPLANT
ELECT REM PT RETURN 9FT ADLT (ELECTROSURGICAL) ×3
ELECTRODE REM PT RTRN 9FT ADLT (ELECTROSURGICAL) ×2 IMPLANT
EXTRACTOR VACUUM M CUP 4 TUBE (SUCTIONS) IMPLANT
GAUZE SPONGE 4X4 12PLY STRL LF (GAUZE/BANDAGES/DRESSINGS) ×6 IMPLANT
GLOVE BIO SURGEON STRL SZ7 (GLOVE) ×6 IMPLANT
GLOVE SS N UNI LF 7.5 STRL (GLOVE) ×2 IMPLANT
GOWN PREVENTION PLUS LG XLONG (DISPOSABLE) ×9 IMPLANT
KIT ABG SYR 3ML LUER SLIP (SYRINGE) IMPLANT
NDL HYPO 25X5/8 SAFETYGLIDE (NEEDLE) ×2 IMPLANT
NEEDLE HYPO 25X5/8 SAFETYGLIDE (NEEDLE) ×3 IMPLANT
NS IRRIG 1000ML POUR BTL (IV SOLUTION) ×3 IMPLANT
PACK C SECTION WH (CUSTOM PROCEDURE TRAY) ×3 IMPLANT
PAD ABD 7.5X8 STRL (GAUZE/BANDAGES/DRESSINGS) IMPLANT
SLEEVE SCD COMPRESS KNEE MED (MISCELLANEOUS) ×1 IMPLANT
SUT CHROMIC 0 CTX 36 (SUTURE) ×9 IMPLANT
SUT MON AB 4-0 PS1 27 (SUTURE) ×3 IMPLANT
SUT PDS AB 0 CT1 27 (SUTURE) ×6 IMPLANT
SUT VIC AB 3-0 CT1 27 (SUTURE) ×6
SUT VIC AB 3-0 CT1 TAPERPNT 27 (SUTURE) ×4 IMPLANT
TOWEL OR 17X24 6PK STRL BLUE (TOWEL DISPOSABLE) ×6 IMPLANT
TRAY FOLEY CATH 14FR (SET/KITS/TRAYS/PACK) ×3 IMPLANT
WATER STERILE IRR 1000ML POUR (IV SOLUTION) ×3 IMPLANT

## 2010-09-26 NOTE — Anesthesia Preprocedure Evaluation (Signed)
Anesthesia Evaluation  Name, MR# and DOB Patient awake  General Assessment Comment  Reviewed: Allergy & Precautions, H&P , Patient's Chart, lab work & pertinent test results  Airway Mallampati: II TM Distance: >3 FB Neck ROM: full    Dental No notable dental hx.    Pulmonary  clear to auscultation  pulmonary exam normalPulmonary Exam Normal breath sounds clear to auscultation none    Cardiovascular     Neuro/Psych Negative Neurological ROS  Negative Psych ROS  GI/Hepatic/Renal negative GI ROS  negative Liver ROS  negative Renal ROS        Endo/Other  (+) Hypothyroidism,      Abdominal Normal abdominal exam  (+)   Musculoskeletal   Hematology negative hematology ROS (+)   Peds  Reproductive/Obstetrics (+) Pregnancy    Anesthesia Other Findings             Anesthesia Physical Anesthesia Plan  ASA: II and Emergent  Anesthesia Plan:    Post-op Pain Management:    Induction:   Airway Management Planned:   Additional Equipment:   Intra-op Plan:   Post-operative Plan:   Informed Consent:   Plan Discussed with:   Anesthesia Plan Comments:         Anesthesia Quick Evaluation

## 2010-09-26 NOTE — Anesthesia Preprocedure Evaluation (Signed)
Anesthesia Evaluation  Name, MR# and DOB Patient awake  General Assessment Comment  Reviewed: Allergy & Precautions, H&P , Patient's Chart, lab work & pertinent test results  Airway Mallampati: II TM Distance: >3 FB Neck ROM: full    Dental No notable dental hx.    Pulmonary  clear to auscultation  pulmonary exam normalPulmonary Exam Normal breath sounds clear to auscultation none    Cardiovascular     Neuro/Psych Negative Neurological ROS  Negative Psych ROS  GI/Hepatic/Renal negative GI ROS  negative Liver ROS  negative Renal ROS        Endo/Other  (+) Hypothyroidism,      Abdominal Normal abdominal exam  (+)   Musculoskeletal   Hematology negative hematology ROS (+)   Peds  Reproductive/Obstetrics (+) Pregnancy    Anesthesia Other Findings             Anesthesia Physical Anesthesia Plan  ASA: II  Anesthesia Plan: Epidural   Post-op Pain Management:    Induction:   Airway Management Planned:   Additional Equipment:   Intra-op Plan:   Post-operative Plan:   Informed Consent: I have reviewed the patients History and Physical, chart, labs and discussed the procedure including the risks, benefits and alternatives for the proposed anesthesia with the patient or authorized representative who has indicated his/her understanding and acceptance.     Plan Discussed with:   Anesthesia Plan Comments:         Anesthesia Quick Evaluation

## 2010-09-26 NOTE — Anesthesia Procedure Notes (Signed)
Epidural Patient location during procedure: OB Start time: 09/26/2010 1:52 PM End time: 09/26/2010 2:02 PM Reason for block: procedure for pain  Staffing Anesthesiologist: Sandrea Hughs Performed by: anesthesiologist   Preanesthetic Checklist Completed: patient identified, site marked, surgical consent, pre-op evaluation, timeout performed, IV checked, risks and benefits discussed and monitors and equipment checked  Epidural Patient position: sitting Prep: site prepped and draped and DuraPrep Patient monitoring: continuous pulse ox and blood pressure Approach: midline Injection technique: LOR air  Needle:  Needle type: Tuohy  Needle gauge: 17 G Needle length: 9 cm Needle insertion depth: 6 cm Catheter type: closed end flexible Catheter size: 19 Gauge Catheter at skin depth: 11 cm Test dose: negative and 1.5% lidocaine  Assessment Events: blood not aspirated, injection not painful, no injection resistance, negative IV test and no paresthesia

## 2010-09-26 NOTE — Brief Op Note (Signed)
09/26/2010  8:59 PM  PATIENT:  Monica Nichols  40 y.o. female  PRE-OPERATIVE DIAGNOSIS:  Arrest of descent, Desires Sterilization  POST-OPERATIVE DIAGNOSIS:  Arrest of descent, desires sterilization  PROCEDURE:  Procedure(s): CESAREAN SECTION BILATERAL TUBAL LIGATION  SURGEON:  Surgeon(s): Raynold Blankenbaker [employer]  PHYSICIAN ASSISTANT:   ASSISTANTS: none   ANESTHESIA:   epidural  OR FLUID I/O:  Total I/O In: 1000 [I.V.:1000] Out: 1100 [Urine:600; Blood:500]  BLOOD ADMINISTERED:none  DRAINS: Urinary Catheter (Foley)   LOCAL MEDICATIONS USED:  NONE  SPECIMEN:  Source of Specimen:  placenta>>L+D  DISPOSITION OF SPECIMEN:  L+D  COUNTS:  YES  TOURNIQUET:  * No tourniquets in log *  DICTATION: .Dragon Dictation  PLAN OF CARE: Admit to inpatient   PATIENT DISPOSITION:  PACU - hemodynamically stable.   Delay start of Pharmacological VTE agent (>24hrs) due to surgical blood loss or risk of bleeding:  No  Preoperative diagnosis: Prior cesarean section, CPD, request for sterilization  Postoperative diagnosis: Same  Procedure: Repeat low transverse cesarean section, Filshie clip tubal ligation.  Surgeon: Marcelle Overlie  EBL: 700 cc  Specimens: Placenta to labor and delivery  Procedure and findings:  Patient to the operating room after an adequate level of epidural anesthesia was obtained with the patient in supine position the abdomen prepped and draped in the usual manner for sterile bowel bowel procedures. Foley catheter was arty positioned draining slightly bloody urine. Penicillus is was made excising the scar this was then carried down to the fascia which was incised and extended transversely. Rectus muscle divided in the midline peritoneum entered superiorly without incident and extended vertically. The vesicouterine serosa was incised and the bladder was bluntly and sharply dissected below. There were some significant bladder scarring some minimal dissection was carried out in  decision made to stay high on the uterine incision. Uterine incision was made the lower segment was fairly thin this was extended with blunt dissection clear fluid noted the patient delivered of a healthy female Apgars 9 and 9 the infant was suctioned cord clamped and passed the pediatric team for further care. The placenta was then delivered spontaneously intact. Interior the uterus was wiped clean with laparotomy pack closure obtained with the first layer of 0 chromic in a locked fashion followed by an imbricating layer with chromic. This is hemostatic the bladder was backfilled with sterile milk and noted to be intact this was then connected to straight drain. Bilateral tubes and ovaries were normal Filshie clip was applied at each tube 2 cm from the cornu a right angle with excellent application on each side. Prior to closure sponge denies precast reported as correct x2   Peritoneum closed with a 2-0 Vicryl suture 3-0 Vicryl suture used to reapproximate the rectus muscles in the midline 0 PDS was then used to close the fascia from laterally to midline on either side the subcutaneous tissue was hemostatic a 4-0 Monocryl suture used for subcuticular closure with a sterile pressure dressing. She did receive gentamicin and clindamycin preoperatively since she is allergic to penicillin and Pitocin IV after the cord was clamped.  Dictated with dragon medical, not proofread. Matison Nuccio M. Milana Obey.D.

## 2010-09-26 NOTE — Progress Notes (Signed)
Pushing X 2 hrs w/ stable FHR, ctx q 2-3 min w/ good pushing effort, still C/C/+1 with mild caput>>>discussed CPD and rec. R CS + BTL>>procedure +risks reviewed, permanence of BTL + failure rate of 2-03/998 reviewed.

## 2010-09-26 NOTE — Progress Notes (Signed)
Pt refused to have AROM or Pitocin started at this time

## 2010-09-26 NOTE — Anesthesia Postprocedure Evaluation (Signed)
  Anesthesia Post-op Note  Patient: Monica Nichols  Procedure(s) Performed:  CESAREAN SECTION; BILATERAL TUBAL LIGATION   Patient is awake, responsive, moving her legs, and has signs of resolution of her numbness. Pain and nausea are reasonably well controlled. Vital signs are stable and clinically acceptable. Oxygen saturation is clinically acceptable. There are no apparent anesthetic complications at this time. Patient is ready for discharge.

## 2010-09-26 NOTE — Transfer of Care (Signed)
Immediate Anesthesia Transfer of Care Note  Patient: Monica Nichols  Procedure(s) Performed:  CESAREAN SECTION; BILATERAL TUBAL LIGATION  Patient Location: PACU  Anesthesia Type: Epidural  Level of Consciousness: awake, alert , oriented and patient cooperative  Airway & Oxygen Therapy: Patient Spontanous Breathing  Post-op Assessment: Report given to PACU RN and Post -op Vital signs reviewed and stable  Post vital signs: Reviewed and stable  Complications: No apparent anesthesia complications

## 2010-09-26 NOTE — Consult Note (Signed)
Called to attend repeat C/S for cephalopelvic disproportion at term gestation with h/o GBS negative and no other risk factors other than AMA. At birth infant delivered from vertex with spontaneous cries and active MAE. Given tactile stimulation with drying and bulb suction to naso/oropharynx. No dysmorphic features.  Shown to parents and then carried to Transitional Nursery in warm blanket.   Care to assigned pediatrician.   Dagoberto Ligas MD [facility]

## 2010-09-26 NOTE — Anesthesia Postprocedure Evaluation (Signed)
  Anesthesia Post-op Note  Patient: Monica Nichols  Procedure(s) Performed:  CESAREAN SECTION; BILATERAL TUBAL LIGATION   Patient is awake, responsive, moving her legs, and has signs of resolution of her numbness. Pain and nausea are reasonably well controlled. Vital signs are stable and clinically acceptable. Oxygen saturation is clinically acceptable. There are no apparent anesthetic complications at this time. Patient is ready for discharge.  

## 2010-09-26 NOTE — Progress Notes (Signed)
Now 6-7/C/0>>AROM>>thin mec., ctx q 2-3' with stable FHR

## 2010-09-27 LAB — CBC
Hemoglobin: 11.3 g/dL — ABNORMAL LOW (ref 12.0–15.0)
MCH: 31.1 pg (ref 26.0–34.0)
Platelets: 203 10*3/uL (ref 150–400)
RBC: 3.63 MIL/uL — ABNORMAL LOW (ref 3.87–5.11)
WBC: 14.6 10*3/uL — ABNORMAL HIGH (ref 4.0–10.5)

## 2010-09-27 MED ORDER — BISACODYL 10 MG RE SUPP: 10.0000 mg | Freq: Every day | RECTAL | Status: DC | PRN

## 2010-09-27 MED ORDER — DIPHENHYDRAMINE HCL 25 MG PO CAPS: 25.0000 mg | ORAL_CAPSULE | ORAL | Status: DC | PRN

## 2010-09-27 MED ORDER — LEVOTHYROXINE SODIUM 100 MCG PO TABS
100.0000 ug | ORAL_TABLET | Freq: Every day | ORAL | Status: DC
  Filled 2010-09-27 (×2): qty 1

## 2010-09-27 MED ORDER — SODIUM CHLORIDE 0.9 % IJ SOLN: 3.0000 mL | Freq: Two times a day (BID) | INTRAMUSCULAR | Status: DC

## 2010-09-27 MED ORDER — DIBUCAINE 1 % RE OINT: 1.0000 "application " | TOPICAL_OINTMENT | RECTAL | Status: DC | PRN

## 2010-09-27 MED ORDER — DIPHENHYDRAMINE HCL 50 MG/ML IJ SOLN: 25.0000 mg | INTRAMUSCULAR | Status: DC | PRN

## 2010-09-27 MED ORDER — NALOXONE HCL 0.4 MG/ML IJ SOLN: 0.4000 mg | INTRAMUSCULAR | Status: DC | PRN

## 2010-09-27 MED ORDER — IBUPROFEN 800 MG PO TABS
800.0000 mg | ORAL_TABLET | Freq: Three times a day (TID) | ORAL | Status: DC | PRN
  Administered 2010-09-27 – 2010-09-28 (×3): 800 mg via ORAL
  Filled 2010-09-27 (×3): qty 1

## 2010-09-27 MED ORDER — SODIUM CHLORIDE 0.9 % IV SOLN: 250.0000 mL | INTRAVENOUS | Status: DC

## 2010-09-27 MED ORDER — IBUPROFEN 600 MG PO TABS: 600.0000 mg | ORAL_TABLET | Freq: Four times a day (QID) | ORAL | Status: DC | PRN

## 2010-09-27 MED ORDER — LANOLIN HYDROUS EX OINT: 1.0000 "application " | TOPICAL_OINTMENT | CUTANEOUS | Status: DC | PRN

## 2010-09-27 MED ORDER — ONDANSETRON HCL 4 MG/2ML IJ SOLN: 4.0000 mg | Freq: Three times a day (TID) | INTRAMUSCULAR | Status: DC | PRN

## 2010-09-27 MED ORDER — NALBUPHINE SYRINGE 5 MG/0.5 ML
5.0000 mg | INJECTION | INTRAMUSCULAR | Status: DC | PRN
  Filled 2010-09-27: qty 1

## 2010-09-27 MED ORDER — OXYCODONE-ACETAMINOPHEN 5-325 MG PO TABS
1.0000 | ORAL_TABLET | Freq: Four times a day (QID) | ORAL | Status: DC | PRN
  Administered 2010-09-27 – 2010-09-28 (×5): 1 via ORAL
  Filled 2010-09-27 (×5): qty 1

## 2010-09-27 MED ORDER — SENNOSIDES-DOCUSATE SODIUM 8.6-50 MG PO TABS
2.0000 | ORAL_TABLET | Freq: Every day | ORAL | Status: DC
  Administered 2010-09-27: 2 via ORAL

## 2010-09-27 MED ORDER — SODIUM CHLORIDE 0.9 % IJ SOLN: 3.0000 mL | INTRAMUSCULAR | Status: DC | PRN

## 2010-09-27 MED ORDER — MEASLES, MUMPS & RUBELLA VAC ~~LOC~~ INJ
0.5000 mL | INJECTION | Freq: Once | SUBCUTANEOUS | Status: DC
  Filled 2010-09-27: qty 0.5

## 2010-09-27 MED ORDER — PRENATAL PLUS 27-1 MG PO TABS
1.0000 | ORAL_TABLET | Freq: Every day | ORAL | Status: DC
  Administered 2010-09-27 – 2010-09-28 (×2): 1 via ORAL
  Filled 2010-09-27 (×2): qty 1

## 2010-09-27 MED ORDER — DIPHENHYDRAMINE HCL 25 MG PO CAPS: 25.0000 mg | ORAL_CAPSULE | Freq: Four times a day (QID) | ORAL | Status: DC | PRN

## 2010-09-27 MED ORDER — FLEET ENEMA 7-19 GM/118ML RE ENEM: 1.0000 | ENEMA | RECTAL | Status: DC | PRN

## 2010-09-27 MED ORDER — ZOLPIDEM TARTRATE 5 MG PO TABS: 5.0000 mg | ORAL_TABLET | Freq: Every evening | ORAL | Status: DC | PRN

## 2010-09-27 MED ORDER — SIMETHICONE 80 MG PO CHEW
80.0000 mg | CHEWABLE_TABLET | Freq: Three times a day (TID) | ORAL | Status: DC
  Administered 2010-09-27 – 2010-09-28 (×5): 80 mg via ORAL

## 2010-09-27 MED ORDER — MENTHOL 3 MG MT LOZG: 1.0000 | LOZENGE | OROMUCOSAL | Status: DC | PRN

## 2010-09-27 MED ORDER — GENTAMICIN SULFATE 40 MG/ML IJ SOLN: INTRAVENOUS | Status: DC

## 2010-09-27 MED ORDER — SODIUM CHLORIDE 0.9 % IV SOLN
1.0000 ug/kg/h | INTRAVENOUS | Status: DC | PRN
  Filled 2010-09-27: qty 2.5

## 2010-09-27 MED ORDER — KETOROLAC TROMETHAMINE 30 MG/ML IJ SOLN: 30.0000 mg | Freq: Four times a day (QID) | INTRAMUSCULAR | Status: AC | PRN

## 2010-09-27 MED ORDER — DIPHENHYDRAMINE HCL 50 MG/ML IJ SOLN: 12.5000 mg | INTRAMUSCULAR | Status: DC | PRN

## 2010-09-27 MED ORDER — TETANUS-DIPHTH-ACELL PERTUSSIS 5-2.5-18.5 LF-MCG/0.5 IM SUSP: 0.5000 mL | Freq: Once | INTRAMUSCULAR | Status: DC

## 2010-09-27 MED ORDER — SIMETHICONE 80 MG PO CHEW: 80.0000 mg | CHEWABLE_TABLET | ORAL | Status: DC | PRN

## 2010-09-27 MED ORDER — METOCLOPRAMIDE HCL 5 MG/ML IJ SOLN: 10.0000 mg | Freq: Three times a day (TID) | INTRAMUSCULAR | Status: DC | PRN

## 2010-09-27 MED ORDER — WITCH HAZEL-GLYCERIN EX PADS: 1.0000 "application " | MEDICATED_PAD | CUTANEOUS | Status: DC | PRN

## 2010-09-27 NOTE — Anesthesia Postprocedure Evaluation (Signed)
  Anesthesia Post-op Note  Patient: Monica Nichols  Procedure(s) Performed: * No procedures listed *  Patient Location: PACU and Mother/Baby  Anesthesia Type: Epidural  Level of Consciousness: awake and alert   Airway and Oxygen Therapy: Patient Spontanous Breathing  Post-op Pain: none  Post-op Assessment: Post-op Vital signs reviewed and Patient's Cardiovascular Status Stable  Post-op Vital Signs: Reviewed and stable  Complications: No apparent anesthesia complications

## 2010-09-27 NOTE — Progress Notes (Signed)
Subjective: Postpartum Day 1: Cesarean Delivery Patient reports tolerating PO and + flatus.    Objective: Vital signs in last 24 hours: Temp:  [97.7 F (36.5 C)-99.5 F (37.5 C)] 99 F (37.2 C) (09/21 0630) Pulse Rate:  [72-110] 72  (09/21 0630) Resp:  [15-21] 18  (09/21 0630) BP: (60-133)/(14-77) 114/56 mmHg (09/21 0630) SpO2:  [96 %-100 %] 96 % (09/21 0630)  Physical Exam:  General: alert and cooperative Lochia: appropriate Uterine Fundus: firm abd dressing CDI Foley with adequate op +BS DVT Evaluation: No evidence of DVT seen on physical exam.   Basename 09/27/10 0525 09/25/10 1945  HGB 11.3* 13.4  HCT 33.8* 39.3    Assessment/Plan: Status post Cesarean section. Doing well postoperatively.  Continue current care.  Janeka Libman G 09/27/2010, 9:09 AM

## 2010-09-28 MED ORDER — OXYCODONE-ACETAMINOPHEN 5-325 MG PO TABS: 1.0000 | ORAL_TABLET | Freq: Four times a day (QID) | ORAL | Status: AC | PRN

## 2010-09-28 NOTE — Anesthesia Postprocedure Evaluation (Signed)
  Anesthesia Post-op Note  Patient: Monica Nichols  Procedure(s) Performed: * No procedures listed *  Patient Location: PACU and Mother/Baby  Anesthesia Type: Epidural  Level of Consciousness: awake, alert  and oriented  Airway and Oxygen Therapy: Patient Spontanous Breathing  Post-op Assessment: Patient's Cardiovascular Status Stable and Respiratory Function Stable  Post-op Vital Signs: stable  Complications: No apparent anesthesia complications

## 2010-09-28 NOTE — Addendum Note (Signed)
Addendum  created 09/28/10 0817 by Terrilee Dudzik L. Juliane Guest   Modules edited:Anesthesia Blocks and Procedures, Inpatient Notes

## 2010-09-28 NOTE — Anesthesia Postprocedure Evaluation (Signed)
Anesthesia Post Note  Patient: Monica Nichols  Procedure(s) Performed: * No procedures listed *  Anesthesia type: Epidural  Patient location: Mother/Baby  Post pain: Pain level controlled  Post assessment: Post-op Vital signs reviewed  Last Vitals: There were no vitals filed for this visit.  Post vital signs: Reviewed  Level of consciousness: awake  Complications: No apparent anesthesia complications

## 2010-09-28 NOTE — Discharge Summary (Signed)
Obstetric Discharge Summary Reason for Admission: onset of labor Prenatal Procedures: ultrasound Intrapartum Procedures: cesarean: low cervical, transverse Postpartum Procedures: none Complications-Operative and Postpartum: none Hemoglobin  Date Value Range Status  09/27/2010 11.3* 12.0-15.0 (g/dL) Final     HCT  Date Value Range Status  09/27/2010 33.8* 36.0-46.0 (%) Final    Discharge Diagnoses: Term Pregnancy-delivered  Discharge Information: Date: 09/28/2010 Activity: pelvic rest Diet: routine Medications: PNV and Percocet Condition: stable Instructions: refer to practice specific booklet Discharge to: home Follow-up Information    Follow up with Danaye Sobh II,Burnice Oestreicher E in 2 weeks.   Contact information:   [address] [place] [address] [phone]          Newborn Data: Live born female  Birth Weight: 6 lb 14.1 oz (3120 g) APGAR: 9, 9  Home with mother.  Gagandeep Pettet II,Husna Krone E 09/28/2010, 8:46 AM

## 2010-09-28 NOTE — Progress Notes (Signed)
No C/O Ambulating, voiding,good pain relief  VSS Afeb Abd soft Incision healing well  D/C home  Instructions given Percocet 5/325mg , #30 PNV

## 2010-09-28 NOTE — Anesthesia Procedure Notes (Signed)
Procedures

## 2010-09-30 ENCOUNTER — Encounter (HOSPITAL_COMMUNITY): Payer: Self-pay | Admitting: Obstetrics and Gynecology

## 2010-10-01 NOTE — Progress Notes (Signed)
UR chart review completed.  

## 2010-10-14 ENCOUNTER — Encounter (HOSPITAL_COMMUNITY)
Admission: AD | Disposition: A | Discharge status: Home / Self Care | Payer: Self-pay | Source: Ambulatory Visit | Attending: Obstetrics and Gynecology

## 2010-10-14 SURGERY — Surgical Case
Anesthesia: Spinal | Laterality: Bilateral

## 2011-05-21 ENCOUNTER — Other Ambulatory Visit: Payer: Self-pay | Admitting: Obstetrics and Gynecology

## 2011-05-21 DIAGNOSIS — Z1231 Encounter for screening mammogram for malignant neoplasm of breast: Secondary | ICD-10-CM

## 2012-12-22 ENCOUNTER — Ambulatory Visit (INDEPENDENT_AMBULATORY_CARE_PROVIDER_SITE_OTHER): Payer: BC Managed Care – PPO | Admitting: Family Medicine

## 2012-12-22 VITALS — BP 110/62 | HR 97 | Temp 98.7°F | Resp 18 | Wt 140.0 lb

## 2012-12-22 DIAGNOSIS — J09X2 Influenza due to identified novel influenza A virus with other respiratory manifestations: Secondary | ICD-10-CM

## 2012-12-22 DIAGNOSIS — R509 Fever, unspecified: Secondary | ICD-10-CM

## 2012-12-22 LAB — POCT INFLUENZA A/B
Influenza A, POC: POSITIVE
Influenza B, POC: NEGATIVE

## 2012-12-22 NOTE — Progress Notes (Signed)
[facility] [address], [place] [address] [phone]  Date:  12/22/2012   Name:  Monica Nichols   DOB:  [birth year]   MRN:  [id number]  PCP:  No primary provider on file.    Chief Complaint: Nasal Congestion, Cough and Fever   History of Present Illness:  Monica Nichols is a 42 y.o. very pleasant female patient who presents with the following:  Here today with illness. She has had a runny, drippy nose and PND.  She has been coughing a good bit.  She has noted some chest congestion . She had a fever for a short period on Saturday- today is Wednesday.  Saturday was the start of her illness.   Cough is non- productive.   No ST.   No GI symptoms.  She tried some claritin- did not help. She has bought some zyrtec but not tried it yet.  She has tried some mucinex DM Last dose of fever reducer at 8am.   She had a history gestational diabetes and is hypothyroid. Otherwise she is generally healthy She is nursing now.  Her 41 year old daugheer has had a cold as well  LMP about 2 weeks ago There are no active problems to display for this patient.   Past Medical History  Diagnosis Date  . Hypothyroid   . Diabetes mellitus without complication     Past Surgical History  Procedure Laterality Date  . Cesarean section    . Cesarean section  09/26/2010    Procedure: CESAREAN SECTION;  Surgeon: Meriel Pica;  Location: [facility];  Service: Gynecology;  Laterality: N/A;  . Tubal ligation  09/26/2010    Procedure: BILATERAL TUBAL LIGATION;  Surgeon: Meriel Pica;  Location: [facility];  Service: Gynecology;;    History  Substance Use Topics  . Smoking status: Never Smoker   . Smokeless tobacco: Not on file  . Alcohol Use: No    Family History  Problem Relation Age of Onset  . Diabetes Mother   . Hypertension Mother   . Hyperlipidemia Father   . Stroke Maternal Grandfather   . Heart disease Paternal Grandmother   . Hyperlipidemia Paternal Grandmother    . Heart disease Paternal Grandfather     Allergies  Allergen Reactions  . Penicillins Rash and Other (See Comments)    Childhood reaction    Medication list has been reviewed and updated.  Current Outpatient Prescriptions on File Prior to Visit  Medication Sig Dispense Refill  . levothyroxine (SYNTHROID, LEVOTHROID) 100 MCG tablet Take 100 mcg by mouth daily.        . prenatal vitamin w/FE, FA (PRENATAL 1 + 1) 27-1 MG TABS Take 1 tablet by mouth daily.         No current facility-administered medications on file prior to visit.    Review of Systems:  As per HPI- otherwise negative.   Physical Examination: Filed Vitals:   12/22/12 1047  BP: 110/62  Pulse: 97  Temp: 98.7 F (37.1 C)  Resp: 18   Filed Vitals:   12/22/12 1047  Weight: 140 lb (63.504 kg)   Body mass index is 25.6 kg/(m^2). Ideal Body Weight:    GEN: WDWN, NAD, Non-toxic, A & O x 3 HEENT: Atraumatic, Normocephalic. Neck supple. No masses, No LAD.  Bilateral TM wnl, oropharynx normal.  PEERL,EOMI.   Ears and Nose: No external deformity. CV: RRR, No M/G/R. No JVD. No thrill. No extra heart sounds.  PULM: CTA B, no wheezes, crackles, rhonchi. No retractions. No resp. distress. No accessory muscle use. ABD: S, NT, ND. No rebound. No HSM. EXTR: No c/c/e NEURO Normal gait.  PSYCH: Normally interactive. Conversant. Not depressed or anxious appearing.  Calm demeanor.   Results for orders placed in visit on 12/22/12  POCT INFLUENZA A/B      Result Value Range   Influenza A, POC Positive     Influenza B, POC Negative      Assessment and Plan: Fever, unspecified - Plan: POCT Influenza A/B  Influenza due to identified novel influenza A virus with other respiratory manifestations  Influenza.  At this time she is too late for tamiflu and does not wish to use it anyway due to lactation.   Explained that many pediatricians do not recommend tamiflu prophylaxis, but she can certainly call and ask what her  pediatrician would like to do for her young children. Explained that both acetaminophen and ibuprofen are considered to be safe for lactation  Signed Abbe Amsterdam, MD

## 2012-12-22 NOTE — Patient Instructions (Signed)
You have the flu.  Rest, drink fluids, and use medications for pain and fever as needed.  Please call your pediatrician and ask about tamiflu prophylaxis for your kids.    If you are not getting better in the next few days please let me know- Sooner if worse.

## 2014-07-07 ENCOUNTER — Other Ambulatory Visit: Payer: Self-pay | Admitting: Obstetrics and Gynecology

## 2014-07-11 LAB — CYTOLOGY - PAP

## 2016-02-18 ENCOUNTER — Ambulatory Visit (INDEPENDENT_AMBULATORY_CARE_PROVIDER_SITE_OTHER): Payer: BC Managed Care – PPO | Admitting: Psychology

## 2016-02-18 DIAGNOSIS — F4321 Adjustment disorder with depressed mood: Secondary | ICD-10-CM

## 2016-03-26 ENCOUNTER — Ambulatory Visit (INDEPENDENT_AMBULATORY_CARE_PROVIDER_SITE_OTHER): Payer: BC Managed Care – PPO | Admitting: Psychology

## 2016-03-26 DIAGNOSIS — F4321 Adjustment disorder with depressed mood: Secondary | ICD-10-CM

## 2016-04-28 ENCOUNTER — Ambulatory Visit: Payer: BC Managed Care – PPO | Admitting: Psychology

## 2016-05-13 ENCOUNTER — Ambulatory Visit (INDEPENDENT_AMBULATORY_CARE_PROVIDER_SITE_OTHER): Payer: BC Managed Care – PPO | Admitting: Psychology

## 2016-05-13 DIAGNOSIS — F4321 Adjustment disorder with depressed mood: Secondary | ICD-10-CM | POA: Diagnosis not present

## 2016-06-10 ENCOUNTER — Ambulatory Visit: Payer: BC Managed Care – PPO | Admitting: Psychology

## 2016-07-23 ENCOUNTER — Ambulatory Visit (INDEPENDENT_AMBULATORY_CARE_PROVIDER_SITE_OTHER): Payer: BC Managed Care – PPO | Admitting: Psychology

## 2016-07-23 DIAGNOSIS — F4321 Adjustment disorder with depressed mood: Secondary | ICD-10-CM

## 2016-09-18 ENCOUNTER — Ambulatory Visit (INDEPENDENT_AMBULATORY_CARE_PROVIDER_SITE_OTHER): Payer: BC Managed Care – PPO | Admitting: Psychology

## 2016-09-18 DIAGNOSIS — F4321 Adjustment disorder with depressed mood: Secondary | ICD-10-CM

## 2016-10-02 ENCOUNTER — Ambulatory Visit (INDEPENDENT_AMBULATORY_CARE_PROVIDER_SITE_OTHER): Payer: BC Managed Care – PPO | Admitting: Psychology

## 2016-10-02 DIAGNOSIS — F4321 Adjustment disorder with depressed mood: Secondary | ICD-10-CM

## 2016-11-12 ENCOUNTER — Ambulatory Visit (INDEPENDENT_AMBULATORY_CARE_PROVIDER_SITE_OTHER): Payer: BC Managed Care – PPO | Admitting: Psychology

## 2016-11-12 DIAGNOSIS — F4321 Adjustment disorder with depressed mood: Secondary | ICD-10-CM

## 2016-12-19 ENCOUNTER — Ambulatory Visit (INDEPENDENT_AMBULATORY_CARE_PROVIDER_SITE_OTHER): Payer: BC Managed Care – PPO | Admitting: Psychology

## 2016-12-19 DIAGNOSIS — F4321 Adjustment disorder with depressed mood: Secondary | ICD-10-CM | POA: Diagnosis not present

## 2017-02-02 ENCOUNTER — Ambulatory Visit (INDEPENDENT_AMBULATORY_CARE_PROVIDER_SITE_OTHER): Payer: BC Managed Care – PPO | Admitting: Psychology

## 2017-02-02 DIAGNOSIS — F4321 Adjustment disorder with depressed mood: Secondary | ICD-10-CM

## 2017-03-11 ENCOUNTER — Ambulatory Visit (INDEPENDENT_AMBULATORY_CARE_PROVIDER_SITE_OTHER): Payer: BC Managed Care – PPO | Admitting: Psychology

## 2017-03-11 DIAGNOSIS — F4321 Adjustment disorder with depressed mood: Secondary | ICD-10-CM

## 2017-03-13 ENCOUNTER — Encounter: Payer: Self-pay | Admitting: Gastroenterology

## 2017-04-22 ENCOUNTER — Ambulatory Visit (INDEPENDENT_AMBULATORY_CARE_PROVIDER_SITE_OTHER): Payer: BC Managed Care – PPO | Admitting: Psychology

## 2017-04-22 DIAGNOSIS — F4321 Adjustment disorder with depressed mood: Secondary | ICD-10-CM

## 2017-04-28 ENCOUNTER — Ambulatory Visit: Payer: Self-pay | Admitting: Gastroenterology

## 2017-06-10 ENCOUNTER — Ambulatory Visit: Payer: Self-pay | Admitting: Gastroenterology

## 2017-06-11 ENCOUNTER — Ambulatory Visit (INDEPENDENT_AMBULATORY_CARE_PROVIDER_SITE_OTHER): Payer: BC Managed Care – PPO | Admitting: Psychology

## 2017-06-11 DIAGNOSIS — F4321 Adjustment disorder with depressed mood: Secondary | ICD-10-CM

## 2017-08-04 ENCOUNTER — Ambulatory Visit (INDEPENDENT_AMBULATORY_CARE_PROVIDER_SITE_OTHER): Payer: BC Managed Care – PPO | Admitting: Psychology

## 2017-08-04 DIAGNOSIS — F4321 Adjustment disorder with depressed mood: Secondary | ICD-10-CM

## 2017-08-25 ENCOUNTER — Ambulatory Visit: Payer: Self-pay | Admitting: Gastroenterology

## 2017-09-14 ENCOUNTER — Ambulatory Visit: Payer: BC Managed Care – PPO | Admitting: Gastroenterology

## 2017-09-14 ENCOUNTER — Encounter: Payer: Self-pay | Admitting: Gastroenterology

## 2017-09-14 VITALS — BP 120/84 | HR 80 | Ht 62.0 in | Wt 153.0 lb

## 2017-09-14 DIAGNOSIS — Z8371 Family history of colonic polyps: Secondary | ICD-10-CM | POA: Diagnosis not present

## 2017-09-14 DIAGNOSIS — Z1211 Encounter for screening for malignant neoplasm of colon: Secondary | ICD-10-CM | POA: Diagnosis not present

## 2017-09-14 NOTE — Patient Instructions (Signed)
Normal BMI (Body Mass Index- based on height and weight) is between 19 and 25. Your BMI today is Body mass index is 27.98 kg/m. Monica Nichols Please consider follow up  regarding your BMI with your Primary Care Provider.  You have been sent a code to sign up for my chart  Thank you for entrusting me with your care and choosing [facility] care.  Dr Rush Landmark

## 2017-09-15 ENCOUNTER — Encounter: Payer: Self-pay | Admitting: Gastroenterology

## 2017-09-15 DIAGNOSIS — Z1211 Encounter for screening for malignant neoplasm of colon: Secondary | ICD-10-CM | POA: Insufficient documentation

## 2017-09-15 DIAGNOSIS — Z8371 Family history of colonic polyps: Secondary | ICD-10-CM | POA: Insufficient documentation

## 2017-09-15 NOTE — Progress Notes (Signed)
[facility] VISIT   Primary Care Provider Shon Baton, MD [address] [place] [address] [phone]  Referring Provider Shon Baton, MD [address] [place],  [address] [phone]  Patient Profile: Monica Nichols is a 47 y.o. female with a pmh significant for prior gestational diabetes, hypothyroidism, migraines, family history of colon polyps (possible advanced lesion -requiring surgical resection).  The patient presents to the [facility] for an evaluation and management of problem(s) noted below:  Problem List  1. Family history of colonic polyps   2. Colon cancer screening     History of Present Illness: This the patient's first visit to the [facility].  She was referred by her primary care physician because of a family history in her father who was at the age of 64 found to have a colon polyp could not be resected endoscopically; due to this concern for possible need of early colon cancer screening.  This was referred to a surgeon who subsequently had her father undergo a segmental resection of his colon.  The patient reports that her father had a 3-year follow-up colonoscopy followed by two 5-year follow-up colonoscopies.  She describes her father telling the patient that the polyp that was resected did not show evidence of cancer, but also was not precancerous.  She does not know the exact pathology of her father's colon polyp.  The patient is otherwise very healthy and has no major significant complaints.  She is a Art gallery manager music (mostly piano).  She is married with 2 children.  GI Review of Systems Positive as above Negative for pyrosis, dysphagia, odynophagia, change in appetite, change in bowel habits, melena, hematochezia  Review of Systems General: Denies fevers/chills/weight loss HEENT: Denies oral lesions Cardiovascular: Denies chest pain Pulmonary: Denies shortness of  breath Gastroenterological: See HPI Genitourinary: Denies dark and urine Hematological: Denies easy bruising/bleeding Endocrine: Denies temperature intolerance Dermatological: Denies jaundice Psychological: Mood is stable Musculoskeletal: Denies new arthralgias   Medications Current Outpatient Medications  Medication Sig Dispense Refill  . acetaminophen (TYLENOL) 500 MG tablet Take 500 mg by mouth every 6 (six) hours as needed.    Marland Kitchen dextromethorphan-guaiFENesin (MUCINEX DM) 30-600 MG per 12 hr tablet Take 1 tablet by mouth 2 (two) times daily.    Marland Kitchen levothyroxine (SYNTHROID, LEVOTHROID) 100 MCG tablet Take 100 mcg by mouth daily.      Marland Kitchen loratadine (CLARITIN) 10 MG tablet Take 10 mg by mouth daily.     No current facility-administered medications for this visit.     Allergies Allergies  Allergen Reactions  . Penicillins Rash and Other (See Comments)    Childhood reaction    Histories Past Medical History:  Diagnosis Date  . Diabetes mellitus without complication ([place])   . Goiter   . Hypothyroid   . Migraines    Past Surgical History:  Procedure Laterality Date  . CESAREAN SECTION    . CESAREAN SECTION  09/26/2010   Procedure: CESAREAN SECTION;  Surgeon: Margarette Asal;  Location: [facility];  Service: Gynecology;  Laterality: N/A;  . TUBAL LIGATION  09/26/2010   Procedure: BILATERAL TUBAL LIGATION;  Surgeon: Margarette Asal;  Location: [facility];  Service: Gynecology;;   Social History   Socioeconomic History  . Marital status: Married    Spouse name: Not on file  . Number of children: 2  . Years of education: Not on file  . Highest education level: Not on file  Occupational History  .  Not on file  Social Needs  . Financial resource strain: Not on file  . Food insecurity:    Worry: Not on file    Inability: Not on file  . Transportation needs:    Medical: Not on file    Non-medical: Not on file  Tobacco Use  . Smoking status: Never Smoker  . Smokeless  tobacco: Never Used  Substance and Sexual Activity  . Alcohol use: No  . Drug use: No  . Sexual activity: Not on file  Lifestyle  . Physical activity:    Days per week: Not on file    Minutes per session: Not on file  . Stress: Not on file  Relationships  . Social connections:    Talks on phone: Not on file    Gets together: Not on file    Attends religious service: Not on file    Active member of club or organization: Not on file    Attends meetings of clubs or organizations: Not on file    Relationship status: Not on file  . Intimate partner violence:    Fear of current or ex partner: Not on file    Emotionally abused: Not on file    Physically abused: Not on file    Forced sexual activity: Not on file  Other Topics Concern  . Not on file  Social History Narrative  . Not on file   Family History  Problem Relation Age of Onset  . Diabetes Mother   . Hypertension Mother   . Hyperlipidemia Father   . Colon polyps Father 61  . Stroke Maternal Grandfather   . Heart disease Paternal Grandmother   . Hyperlipidemia Paternal Grandmother   . Heart disease Paternal Grandfather   . Esophageal cancer Neg Hx   . Inflammatory bowel disease Neg Hx   . Liver disease Neg Hx   . Pancreatic cancer Neg Hx   . Rectal cancer Neg Hx   . Stomach cancer Neg Hx    I have reviewed her medical, social, and family history in detail and updated the electronic medical record as necessary.    PHYSICAL EXAMINATION  BP 120/84   Pulse 80   Ht 5\' 2"  (1.575 m)   Wt 153 lb (69.4 kg)   LMP 08/31/2017 (Approximate)   BMI 27.98 kg/m  Wt Readings from Last 3 Encounters:  09/14/17 153 lb (69.4 kg)  12/22/12 140 lb (63.5 kg)  09/25/10 155 lb 2 oz (70.4 kg)  GEN: NAD, appears stated age, doesn't appear chronically ill PSYCH: Cooperative, without pressured speech EYE: Conjunctivae pink, sclerae anicteric ENT: MMM CV: RR without R/Gs  RESP: CTAB posteriorly GI: NABS, soft, NT/ND, without rebound  or guarding, no HSM appreciated MSK/EXT: No lower extremity edema SKIN: No jaundice NEURO:  Alert & Oriented x 3, no focal deficits   REVIEW OF DATA  I reviewed the following data at the time of this encounter:  GI Procedures and Studies  No prior studies  Laboratory Studies  Reviewed in EPIC  Imaging Studies  No relevant studies   ASSESSMENT  Ms. Stopka is a 47 y.o. female with a pmh significant for prior gestational diabetes, hypothyroidism, migraines, family history of colon polyps (possible advanced lesion -requiring surgical resection).  The patient is seen today for evaluation and management of:  1. Family history of colonic polyps   2. Colon cancer screening    The patient is being evaluated for consideration of high risk colon cancer screening in the  setting of a presumed family history of an advanced neoplastic lesion/polyp.  The patient's father underwent a surgical resection for removal of a reported polyp at the age of 68.  With that being said, the patient's father has previously described his polyp as not cancer and also not precancerous.  This confusing terminology makes it difficult for [place] to know whether she would be classified as an individual who has the need for high risk screening versus average risk screening.  Recent guidelines suggested the consideration of all patients being started for colon cancer screening at the age of 77.  However, most insurances do not agree with this as of yet as more data had been suggested.  As such I do think if the patient's father can obtain his pathology results and gives permission for his daughter to review those then that will help guide [place] as to knowing whether this patient will need high risk screening at which point she should begin screening now.  If we are unable to determine the pathology of the specimen, then I do not think it would be unreasonable to consider a colonoscopy as resections of lesions surgically in the colon are  oftentimes advanced neoplasias.  We will consider our options after the patient obtains her father's records or tries to obtain them.  All questions were answered to the best of my ability today and the patient agrees with this plan of action.   PLAN  1. Family history of colonic polyps -To obtain father's records for review of surgical resection pathology and she will fax back to [place] the results  2. Colon cancer screening - Likely will become a high risk screening patient based on at least the clinical history obtained and then can begin colonoscopy evaluation unless pathology from father is not consistent with this   No orders of the defined types were placed in this encounter.   New Prescriptions   No medications on file   Modified Medications   No medications on file    Planned Follow Up: No follow-ups on file.   Justice Britain, MD [facility]oscopy Office # [phone]

## 2017-09-24 ENCOUNTER — Ambulatory Visit: Payer: BC Managed Care – PPO | Admitting: Psychology

## 2017-09-28 ENCOUNTER — Ambulatory Visit (INDEPENDENT_AMBULATORY_CARE_PROVIDER_SITE_OTHER): Payer: BC Managed Care – PPO | Admitting: Psychology

## 2017-09-28 DIAGNOSIS — F4321 Adjustment disorder with depressed mood: Secondary | ICD-10-CM | POA: Diagnosis not present

## 2017-11-13 ENCOUNTER — Ambulatory Visit (INDEPENDENT_AMBULATORY_CARE_PROVIDER_SITE_OTHER): Payer: BC Managed Care – PPO | Admitting: Psychology

## 2017-11-13 DIAGNOSIS — F4321 Adjustment disorder with depressed mood: Secondary | ICD-10-CM

## 2017-12-21 ENCOUNTER — Ambulatory Visit (INDEPENDENT_AMBULATORY_CARE_PROVIDER_SITE_OTHER): Payer: BC Managed Care – PPO | Admitting: Psychology

## 2017-12-21 DIAGNOSIS — F411 Generalized anxiety disorder: Secondary | ICD-10-CM | POA: Diagnosis not present

## 2018-02-01 ENCOUNTER — Ambulatory Visit (INDEPENDENT_AMBULATORY_CARE_PROVIDER_SITE_OTHER): Payer: BC Managed Care – PPO | Admitting: Psychology

## 2018-02-01 DIAGNOSIS — F411 Generalized anxiety disorder: Secondary | ICD-10-CM | POA: Diagnosis not present

## 2018-03-17 ENCOUNTER — Ambulatory Visit: Payer: BC Managed Care – PPO | Admitting: Psychology

## 2018-04-05 ENCOUNTER — Ambulatory Visit: Payer: BC Managed Care – PPO | Admitting: Psychology

## 2018-05-24 ENCOUNTER — Ambulatory Visit (INDEPENDENT_AMBULATORY_CARE_PROVIDER_SITE_OTHER): Payer: BC Managed Care – PPO | Admitting: Psychology

## 2018-05-24 DIAGNOSIS — F411 Generalized anxiety disorder: Secondary | ICD-10-CM

## 2018-07-19 ENCOUNTER — Ambulatory Visit (INDEPENDENT_AMBULATORY_CARE_PROVIDER_SITE_OTHER): Payer: BC Managed Care – PPO | Admitting: Psychology

## 2018-07-19 DIAGNOSIS — F411 Generalized anxiety disorder: Secondary | ICD-10-CM

## 2018-09-20 ENCOUNTER — Ambulatory Visit: Payer: BC Managed Care – PPO | Admitting: Psychology

## 2018-10-04 ENCOUNTER — Ambulatory Visit (INDEPENDENT_AMBULATORY_CARE_PROVIDER_SITE_OTHER): Payer: BC Managed Care – PPO | Admitting: Psychology

## 2018-10-04 DIAGNOSIS — F411 Generalized anxiety disorder: Secondary | ICD-10-CM | POA: Diagnosis not present

## 2018-11-22 ENCOUNTER — Ambulatory Visit (INDEPENDENT_AMBULATORY_CARE_PROVIDER_SITE_OTHER): Payer: BC Managed Care – PPO | Admitting: Psychology

## 2018-11-22 DIAGNOSIS — F411 Generalized anxiety disorder: Secondary | ICD-10-CM | POA: Diagnosis not present

## 2019-01-12 ENCOUNTER — Ambulatory Visit (INDEPENDENT_AMBULATORY_CARE_PROVIDER_SITE_OTHER): Payer: BC Managed Care – PPO | Admitting: Psychology

## 2019-01-12 DIAGNOSIS — F411 Generalized anxiety disorder: Secondary | ICD-10-CM

## 2019-03-13 ENCOUNTER — Ambulatory Visit: Payer: BC Managed Care – PPO | Attending: Internal Medicine

## 2019-03-13 DIAGNOSIS — Z23 Encounter for immunization: Secondary | ICD-10-CM | POA: Insufficient documentation

## 2019-03-13 NOTE — Progress Notes (Signed)
   Covid-19 Vaccination Clinic  Name:  LAVREN CHIAPPONE    MRN: [id number] DOB: [birth year]  03/13/2019  Ms. Recinos was observed post Covid-19 immunization for 15 minutes without incident. She was provided with Vaccine Information Sheet and instruction to access the V-Safe system.   Ms. Fazel was instructed to call 911 with any severe reactions post vaccine: Marland Kitchen Difficulty breathing  . Swelling of face and throat  . A fast heartbeat  . A bad rash all over body  . Dizziness and weakness   Immunizations Administered    Name Date Dose VIS Date Route   Pfizer COVID-19 Vaccine 03/13/2019  5:40 PM 0.3 mL 12/17/2018 Intramuscular   Manufacturer: [place]   Lot: [id number]   [place]: [id number]

## 2019-03-22 ENCOUNTER — Ambulatory Visit (INDEPENDENT_AMBULATORY_CARE_PROVIDER_SITE_OTHER): Payer: BC Managed Care – PPO | Admitting: Psychology

## 2019-03-22 DIAGNOSIS — F411 Generalized anxiety disorder: Secondary | ICD-10-CM

## 2019-04-12 ENCOUNTER — Ambulatory Visit: Payer: BC Managed Care – PPO | Attending: Internal Medicine

## 2019-04-12 DIAGNOSIS — Z23 Encounter for immunization: Secondary | ICD-10-CM

## 2019-04-12 NOTE — Progress Notes (Signed)
   Covid-19 Vaccination Clinic  Name:  Monica Nichols    MRN: [id number] DOB: 1970-08-03  04/12/2019  Monica Nichols was observed post Covid-19 immunization for 15 minutes without incident. She was provided with Vaccine Information Sheet and instruction to access the V-Safe system.   Monica Nichols was instructed to call 911 with any severe reactions post vaccine: Marland Kitchen Difficulty breathing  . Swelling of face and throat  . A fast heartbeat  . A bad rash all over body  . Dizziness and weakness   Immunizations Administered    Name Date Dose VIS Date Route   Pfizer COVID-19 Vaccine 04/12/2019  2:42 PM 0.3 mL 12/17/2018 Intramuscular   Manufacturer: [place]   Lot: [id number]   [place]: [id number]

## 2019-06-02 ENCOUNTER — Ambulatory Visit (INDEPENDENT_AMBULATORY_CARE_PROVIDER_SITE_OTHER): Payer: BC Managed Care – PPO | Admitting: Psychology

## 2019-06-02 DIAGNOSIS — F411 Generalized anxiety disorder: Secondary | ICD-10-CM | POA: Diagnosis not present

## 2019-08-04 ENCOUNTER — Ambulatory Visit: Payer: BC Managed Care – PPO | Admitting: Psychology

## 2019-08-15 ENCOUNTER — Ambulatory Visit (INDEPENDENT_AMBULATORY_CARE_PROVIDER_SITE_OTHER): Payer: BC Managed Care – PPO | Admitting: Psychology

## 2019-08-15 DIAGNOSIS — F411 Generalized anxiety disorder: Secondary | ICD-10-CM

## 2019-10-13 ENCOUNTER — Ambulatory Visit (INDEPENDENT_AMBULATORY_CARE_PROVIDER_SITE_OTHER): Payer: BC Managed Care – PPO | Admitting: Psychology

## 2019-10-13 DIAGNOSIS — F411 Generalized anxiety disorder: Secondary | ICD-10-CM

## 2019-11-24 ENCOUNTER — Ambulatory Visit (INDEPENDENT_AMBULATORY_CARE_PROVIDER_SITE_OTHER): Payer: BC Managed Care – PPO | Admitting: Psychology

## 2019-11-24 DIAGNOSIS — F411 Generalized anxiety disorder: Secondary | ICD-10-CM

## 2019-12-20 ENCOUNTER — Ambulatory Visit (INDEPENDENT_AMBULATORY_CARE_PROVIDER_SITE_OTHER): Payer: BC Managed Care – PPO | Admitting: Psychology

## 2019-12-20 DIAGNOSIS — F411 Generalized anxiety disorder: Secondary | ICD-10-CM | POA: Diagnosis not present

## 2020-01-02 ENCOUNTER — Ambulatory Visit: Payer: BC Managed Care – PPO | Admitting: Psychology

## 2020-01-30 ENCOUNTER — Ambulatory Visit (INDEPENDENT_AMBULATORY_CARE_PROVIDER_SITE_OTHER): Payer: BC Managed Care – PPO | Admitting: Psychology

## 2020-01-30 DIAGNOSIS — F411 Generalized anxiety disorder: Secondary | ICD-10-CM

## 2020-03-15 ENCOUNTER — Other Ambulatory Visit: Payer: Self-pay | Admitting: Internal Medicine

## 2020-03-15 DIAGNOSIS — E786 Lipoprotein deficiency: Secondary | ICD-10-CM

## 2020-03-21 ENCOUNTER — Ambulatory Visit (INDEPENDENT_AMBULATORY_CARE_PROVIDER_SITE_OTHER): Payer: BC Managed Care – PPO | Admitting: Psychology

## 2020-03-21 DIAGNOSIS — F411 Generalized anxiety disorder: Secondary | ICD-10-CM

## 2020-04-06 ENCOUNTER — Ambulatory Visit
Admission: RE | Admit: 2020-04-06 | Discharge: 2020-04-06 | Disposition: A | Discharge status: Home / Self Care | Payer: No Typology Code available for payment source | Source: Ambulatory Visit | Attending: Internal Medicine | Admitting: Internal Medicine

## 2020-04-06 DIAGNOSIS — E786 Lipoprotein deficiency: Secondary | ICD-10-CM

## 2021-04-02 ENCOUNTER — Encounter: Payer: Self-pay | Admitting: Gastroenterology

## 2021-04-19 ENCOUNTER — Ambulatory Visit (AMBULATORY_SURGERY_CENTER): Payer: BC Managed Care – PPO | Admitting: *Deleted

## 2021-04-19 VITALS — Ht 62.0 in | Wt 157.0 lb

## 2021-04-19 DIAGNOSIS — Z1211 Encounter for screening for malignant neoplasm of colon: Secondary | ICD-10-CM

## 2021-04-19 NOTE — Progress Notes (Signed)
No egg or soy allergy known to patient  ?No issues known to pt with past sedation with any surgeries or procedures ?Patient denies ever being told they had issues or difficulty with intubation  ?No FH of Malignant Hyperthermia ?Pt is not on diet pills ?Pt is not on  home 02  ?Pt is not on blood thinners  ?Pt denies issues with constipation  ?No A fib or A flutter ? ? ?PV completed over the phone. Pt verified name, DOB, address and insurance during PV today.  ? ?Pt encouraged to call with questions or issues.  ?If pt has My chart, procedure instructions sent via My Chart   ?

## 2021-05-01 ENCOUNTER — Encounter: Payer: Self-pay | Admitting: Gastroenterology

## 2021-05-10 ENCOUNTER — Ambulatory Visit (AMBULATORY_SURGERY_CENTER): Payer: BC Managed Care – PPO | Admitting: Gastroenterology

## 2021-05-10 ENCOUNTER — Encounter: Payer: Self-pay | Admitting: Gastroenterology

## 2021-05-10 VITALS — BP 138/84 | HR 68 | Temp 98.9°F | Resp 10 | Ht 62.0 in | Wt 157.0 lb

## 2021-05-10 DIAGNOSIS — Z8371 Family history of colonic polyps: Secondary | ICD-10-CM

## 2021-05-10 DIAGNOSIS — Z1211 Encounter for screening for malignant neoplasm of colon: Secondary | ICD-10-CM

## 2021-05-10 DIAGNOSIS — D123 Benign neoplasm of transverse colon: Secondary | ICD-10-CM

## 2021-05-10 MED ORDER — SODIUM CHLORIDE 0.9 % IV SOLN: 500.0000 mL | Freq: Once | INTRAVENOUS | Status: DC

## 2021-05-10 NOTE — Progress Notes (Signed)
Called to room to assist during endoscopic procedure.  Patient ID and intended procedure confirmed with present staff. Received instructions for my participation in the procedure from the performing physician.  

## 2021-05-10 NOTE — Progress Notes (Signed)
Pt. Reports no change in her medical or surgical history since her pre-visit 04/19/2021. ?

## 2021-05-10 NOTE — Progress Notes (Signed)
[facility] History and Physical ? ? ?Primary Care Physician:  Shon Baton, MD ? ? ?Reason for Procedure:  Colorectal cancer screening ? ?Plan:    Screening colonoscopy with possible interventions as needed ? ? ? ? ?HPI: Monica Nichols is a very pleasant 51 y.o. female here for screening colonoscopy. ?Denies any nausea, vomiting, abdominal pain, melena or bright red blood per rectum ? ?The risks and benefits as well as alternatives of endoscopic procedure(s) have been discussed and reviewed. All questions answered. The patient agrees to proceed. ? ? ? ?Past Medical History:  ?Diagnosis Date  ? Diabetes mellitus without complication ([place])   ? gestational diabetes  ? Goiter   ? Hypothyroid   ? Migraines   ? ? ?Past Surgical History:  ?Procedure Laterality Date  ? CESAREAN SECTION    ? CESAREAN SECTION  09/26/2010  ? Procedure: CESAREAN SECTION;  Surgeon: Margarette Asal;  Location: [facility];  Service: Gynecology;  Laterality: N/A;  ? TUBAL LIGATION  09/26/2010  ? Procedure: BILATERAL TUBAL LIGATION;  Surgeon: Margarette Asal;  Location: [facility];  Service: Gynecology;;  ? [place]  ? ? ?Prior to Admission medications   ?Medication Sig Start Date End Date Taking? Authorizing Provider  ?cetirizine (ZYRTEC) 10 MG chewable tablet Zyrtec   Yes [provider]  ?ibuprofen (ADVIL) 200 MG tablet as needed. 05/28/09  Yes [provider]  ?levothyroxine (SYNTHROID, LEVOTHROID) 100 MCG tablet Take 100 mcg by mouth daily.     Yes [provider]  ?acetaminophen (TYLENOL) 500 MG tablet Take 500 mg by mouth every 6 (six) hours as needed. ?Patient not taking: Reported on 04/19/2021    [provider]  ?dextromethorphan-guaiFENesin (MUCINEX DM) 30-600 MG per 12 hr tablet Take 1 tablet by mouth 2 (two) times daily. ?Patient not taking: Reported on 05/10/2021    [provider]  ? ? ?Current Outpatient Medications  ?Medication Sig Dispense Refill  ? cetirizine  (ZYRTEC) 10 MG chewable tablet Zyrtec    ? ibuprofen (ADVIL) 200 MG tablet as needed.    ? levothyroxine (SYNTHROID, LEVOTHROID) 100 MCG tablet Take 100 mcg by mouth daily.      ? acetaminophen (TYLENOL) 500 MG tablet Take 500 mg by mouth every 6 (six) hours as needed. (Patient not taking: Reported on 04/19/2021)    ? dextromethorphan-guaiFENesin (MUCINEX DM) 30-600 MG per 12 hr tablet Take 1 tablet by mouth 2 (two) times daily. (Patient not taking: Reported on 05/10/2021)    ? ?Current Facility-Administered Medications  ?Medication Dose Route Frequency Provider Last Rate Last Admin  ? 0.9 %  sodium chloride infusion  500 mL Intravenous Once Zacariah Belue, Venia Minks, MD      ? ? ?Allergies as of 05/10/2021 - Review Complete 05/10/2021  ?Allergen Reaction Noted  ? Penicillins Rash and Other (See Comments) 09/25/2010  ? ? ?Family History  ?Problem Relation Age of Onset  ? Diabetes Mother   ? Hypertension Mother   ? Hyperlipidemia Father   ? Colon polyps Father 50  ? Stroke Maternal Grandfather   ? Heart disease Paternal Grandmother   ? Hyperlipidemia Paternal Grandmother   ? Heart disease Paternal Grandfather   ? Esophageal cancer Neg Hx   ? Inflammatory bowel disease Neg Hx   ? Liver disease Neg Hx   ? Pancreatic cancer Neg Hx   ? Rectal cancer Neg Hx   ? Stomach cancer Neg Hx   ? Colon cancer Neg Hx   ? ? ?  Social History  ? ?Socioeconomic History  ? Marital status: Married  ?  Spouse name: Not on file  ? Number of children: 2  ? Years of education: Not on file  ? Highest education level: Not on file  ?Occupational History  ? Not on file  ?Tobacco Use  ? Smoking status: Never  ? Smokeless tobacco: Never  ?Vaping Use  ? Vaping Use: Never used  ?Substance and Sexual Activity  ? Alcohol use: No  ? Drug use: No  ? Sexual activity: Not on file  ?Other Topics Concern  ? Not on file  ?Social History Narrative  ? Not on file  ? ?Social Determinants of Health  ? ?Financial Resource Strain: Not on file  ?Food Insecurity: Not on file   ?Transportation Needs: Not on file  ?Physical Activity: Not on file  ?Stress: Not on file  ?Social Connections: Not on file  ?Intimate Partner Violence: Not on file  ? ? ?Review of Systems: ? ?All other review of systems negative except as mentioned in the HPI. ? ?Physical Exam: ?Vital signs in last 24 hours: ?BP (!) 149/75   Pulse 69   Temp 98.9 ?F (37.2 ?C)   Ht '5\' 2"'$  (1.575 m)   Wt 157 lb (71.2 kg)   LMP 05/03/2021 (Exact Date)   SpO2 100%   BMI 28.72 kg/m?  ?General:   Alert, NAD ?Lungs:  Clear .   ?Heart:  Regular rate and rhythm ?Abdomen:  Soft, nontender and nondistended. ?Neuro/Psych:  Alert and cooperative. Normal mood and affect. A and O x 3 ? ?Reviewed labs, radiology imaging, old records and pertinent past GI work up ? ?Patient is appropriate for planned procedure(s) and anesthesia in an ambulatory setting ? ? ?K. Denzil Magnuson , MD ?[phone]  ? ? ? ? ?

## 2021-05-10 NOTE — Op Note (Addendum)
[facility] ?Patient Name: Monica Nichols ?Procedure Date: 05/10/2021 9:28 AM ?MRN: [id number] ?Endoscopist: Mauri Pole , MD ?Age: 51 ?Referring MD:  ?Date of Birth: Nov 03, 1970 ?Gender: Female ?Account #: [id number] ?Procedure:                Colonoscopy ?Indications:              Screening for colorectal malignant neoplasm, Family  ?                          history of colonic polyps in a first-degree relative ?Medicines:                Monitored Anesthesia Care ?Procedure:                Pre-Anesthesia Assessment: ?                          - Prior to the procedure, a History and Physical  ?                          was performed, and patient medications and  ?                          allergies were reviewed. The patient's tolerance of  ?                          previous anesthesia was also reviewed. The risks  ?                          and benefits of the procedure and the sedation  ?                          options and risks were discussed with the patient.  ?                          All questions were answered, and informed consent  ?                          was obtained. Prior Anticoagulants: The patient has  ?                          taken no previous anticoagulant or antiplatelet  ?                          agents. ASA Grade Assessment: II - A patient with  ?                          mild systemic disease. After reviewing the risks  ?                          and benefits, the patient was deemed in  ?                          satisfactory condition to undergo the procedure. ?  After obtaining informed consent, the colonoscope  ?                          was passed under direct vision. Throughout the  ?                          procedure, the patient's blood pressure, pulse, and  ?                          oxygen saturations were monitored continuously. The  ?                          PCF-HQ190L Colonoscope was introduced through the  ?                          anus  and advanced to the the cecum, identified by  ?                          appendiceal orifice and ileocecal valve. The  ?                          colonoscopy was performed without difficulty. The  ?                          patient tolerated the procedure well. The quality  ?                          of the bowel preparation was good. The ileocecal  ?                          valve, appendiceal orifice, and rectum were  ?                          photographed. ?Scope In: 9:42:15 AM ?Scope Out: 9:58:21 AM ?Scope Withdrawal Time: 0 hours 11 minutes 52 seconds  ?Total Procedure Duration: 0 hours 16 minutes 6 seconds  ?Findings:                 The perianal and digital rectal examinations were  ?                          normal. ?                          Two sessile polyps were found in the transverse  ?                          colon. The polyps were 1 to 2 mm in size. These  ?                          polyps were removed with a cold biopsy forceps.  ?                          Resection and retrieval were complete. ?  A few small-mouthed diverticula were found in the  ?                          sigmoid colon and descending colon. ?                          Non-bleeding external and internal hemorrhoids were  ?                          found during retroflexion. The hemorrhoids were  ?                          small. ?                          No additional abnormalities were found on  ?                          retroflexion. ?Complications:            No immediate complications. ?Estimated Blood Loss:     Estimated blood loss was minimal. ?Impression:               - Two 1 to 2 mm polyps in the transverse colon,  ?                          removed with a cold biopsy forceps. Resected and  ?                          retrieved. ?                          - Diverticulosis in the sigmoid colon and in the  ?                          descending colon. ?                          - Non-bleeding  external and internal hemorrhoids. ?Recommendation:           - Patient has a contact number available for  ?                          emergencies. The signs and symptoms of potential  ?                          delayed complications were discussed with the  ?                          patient. Return to normal activities tomorrow.  ?                          Written discharge instructions were provided to the  ?                          patient. ?                          -  Resume previous diet. ?                          - Continue present medications. ?                          - Await pathology results. ?                          - Repeat colonoscopy in 5-10 years for surveillance  ?                          based on pathology results, to be scheduled with Dr  ?                          Rush Landmark. ?Mauri Pole, MD ?05/10/2021 10:03:56 AM ?This report has been signed electronically. ?

## 2021-05-10 NOTE — Patient Instructions (Signed)
Information on polyps, diverticulosis and hemorrhoids given to you today.  Await pathology results.  Resume previous diet and medications.  YOU HAD AN ENDOSCOPIC PROCEDURE TODAY AT THE [facility]:   Refer to the procedure report that was given to you for any specific questions about what was found during the examination.  If the procedure report does not answer your questions, please call your gastroenterologist to clarify.  If you requested that your care partner not be given the details of your procedure findings, then the procedure report has been included in a sealed envelope for you to review at your convenience later.  YOU SHOULD EXPECT: Some feelings of bloating in the abdomen. Passage of more gas than usual.  Walking can help get rid of the air that was put into your GI tract during the procedure and reduce the bloating. If you had a lower endoscopy (such as a colonoscopy or flexible sigmoidoscopy) you may notice spotting of blood in your stool or on the toilet paper. If you underwent a bowel prep for your procedure, you may not have a normal bowel movement for a few days.  Please Note:  You might notice some irritation and congestion in your nose or some drainage.  This is from the oxygen used during your procedure.  There is no need for concern and it should clear up in a day or so.  SYMPTOMS TO REPORT IMMEDIATELY:   Following lower endoscopy (colonoscopy or flexible sigmoidoscopy):  Excessive amounts of blood in the stool  Significant tenderness or worsening of abdominal pains  Swelling of the abdomen that is new, acute  Fever of 100F or higher   For urgent or emergent issues, a gastroenterologist can be reached at any hour by calling [phone]. Do not use MyChart messaging for urgent concerns.    DIET:  We do recommend a small meal at first, but then you may proceed to your regular diet.  Drink plenty of fluids but you should avoid alcoholic beverages for 24  hours.  ACTIVITY:  You should plan to take it easy for the rest of today and you should NOT DRIVE or use heavy machinery until tomorrow (because of the sedation medicines used during the test).    FOLLOW UP: Our staff will call the number listed on your records 48-72 hours following your procedure to check on you and address any questions or concerns that you may have regarding the information given to you following your procedure. If we do not reach you, we will leave a message.  We will attempt to reach you two times.  During this call, we will ask if you have developed any symptoms of COVID 19. If you develop any symptoms (ie: fever, flu-like symptoms, shortness of breath, cough etc.) before then, please call [phone].  If you test positive for Covid 19 in the 2 weeks post procedure, please call and report this information to us.    If any biopsies were taken you will be contacted by phone or by letter within the next 1-3 weeks.  Please call us at [phone] if you have not heard about the biopsies in 3 weeks.    SIGNATURES/CONFIDENTIALITY: You and/or your care partner have signed paperwork which will be entered into your electronic medical record.  These signatures attest to the fact that that the information above on your After Visit Summary has been reviewed and is understood.  Full responsibility of the confidentiality of this discharge information lies with you   and/or your care-partner. 

## 2021-05-10 NOTE — Progress Notes (Signed)
To Pacu, VSS. Report to [email or web address] 

## 2021-05-13 ENCOUNTER — Telehealth: Payer: Self-pay

## 2021-05-13 NOTE — Telephone Encounter (Signed)
?  Follow up Call- ? ? ?  05/10/2021  ?  9:06 AM  ?Call back number  ?Post procedure Call Back phone  # [phone]  ?Permission to leave phone message Yes  ?  ? ?Patient questions: ? ?Do you have a fever, pain , or abdominal swelling? No. ?Pain Score  0 * ? ?Have you tolerated food without any problems? Yes.   ? ?Have you been able to return to your normal activities? Yes.   ? ?Do you have any questions about your discharge instructions: ?Diet   No. ?Medications  No. ?Follow up visit  No. ? ?Do you have questions or concerns about your Care? No. ? ?Actions: ?* If pain score is 4 or above: ?No action needed, pain <4. ? ? ?

## 2021-05-17 ENCOUNTER — Encounter: Payer: Self-pay | Admitting: Gastroenterology

## 2022-03-26 IMAGING — CT CT CARDIAC CORONARY ARTERY CALCIUM SCORE
3 series · 14 of 20 positions shown, 16 images · non-contrast
Comparison: None.

CLINICAL DATA: 50-year-old female with lipoprotein deficiency.

EXAM:
CT CARDIAC CORONARY ARTERY CALCIUM SCORE
TECHNIQUE: Non-contrast imaging through the heart was performed using
prospective ECG gating. Image post processing was performed on an
independent workstation, allowing for quantitative analysis of the
heart and coronary arteries. Note that this exam targets the heart
and the chest was not imaged in its entirety.

[Series 2: calcium scoring 2.00 qr36 bestdiast 70% hrt calciu · axial · 0.30mm/px · z∈[+1641,+1713]mm · 4 of 60 slices shown]
[im 12/60  vessel]
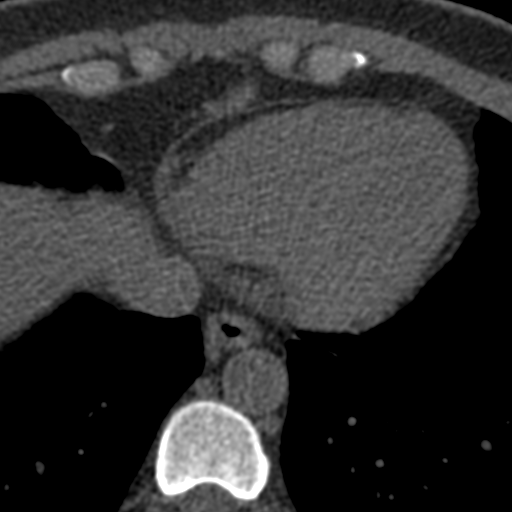
[im 24/60  vessel]
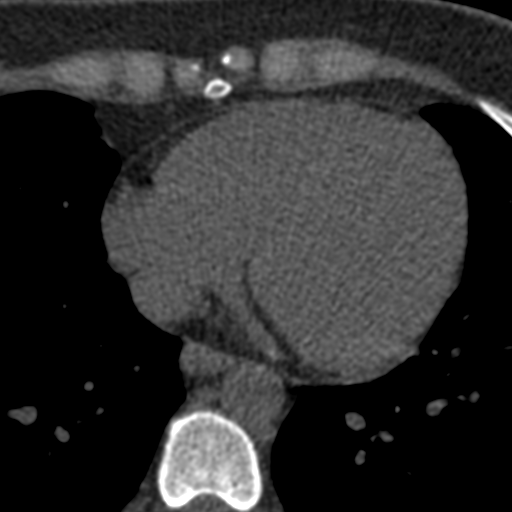
[im 36/60  vessel]
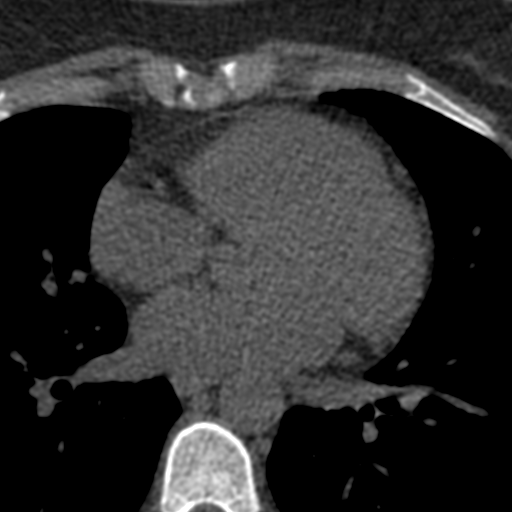
[im 48/60  vessel]
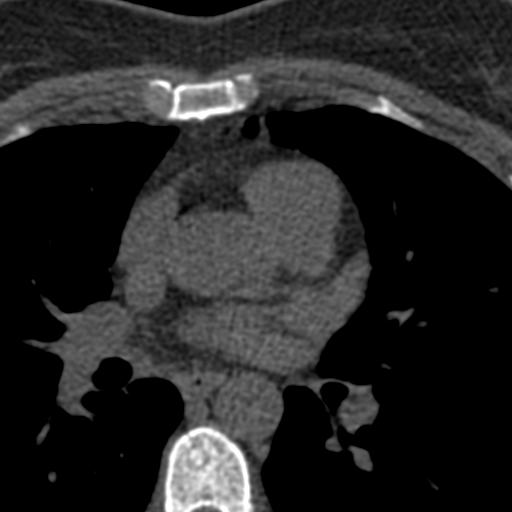

[Series 3: calcium scoring 2.00 br40 bestdiast 70% axial · axial · 0.53mm/px · z∈[+1637,+1717]mm · 5 of 60 slices shown, 7 images]
[im 10/60  vessel]
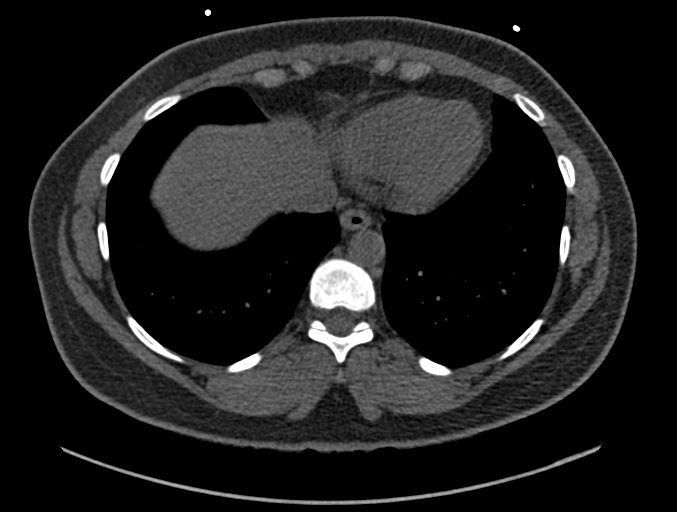
[im 10/60  lung]
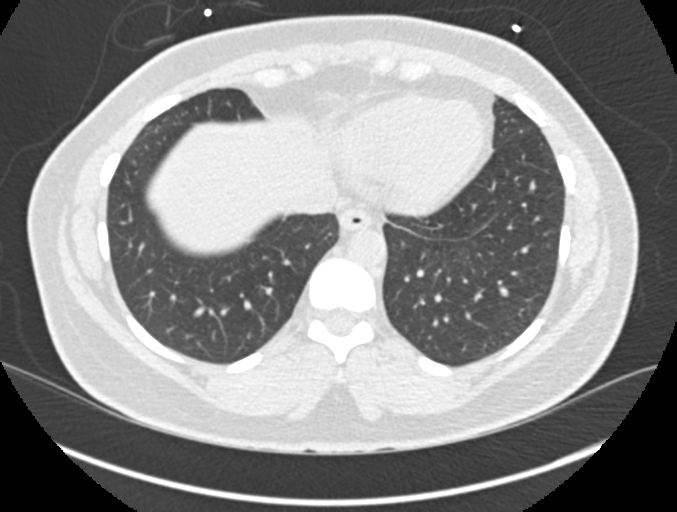
[im 20/60  vessel]
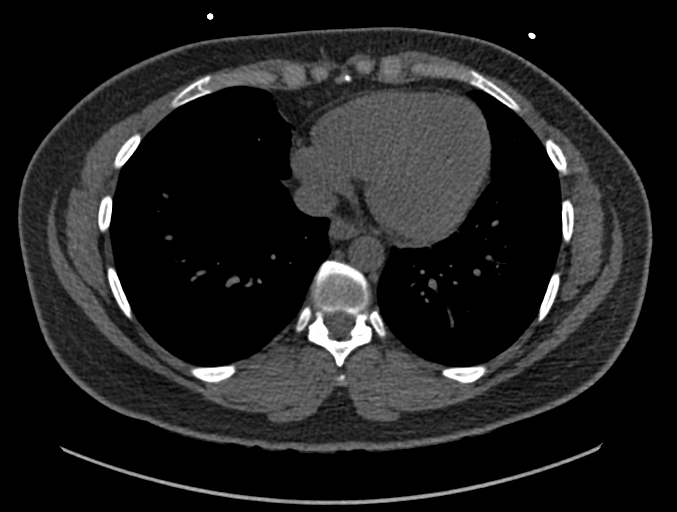
[im 30/60  vessel]
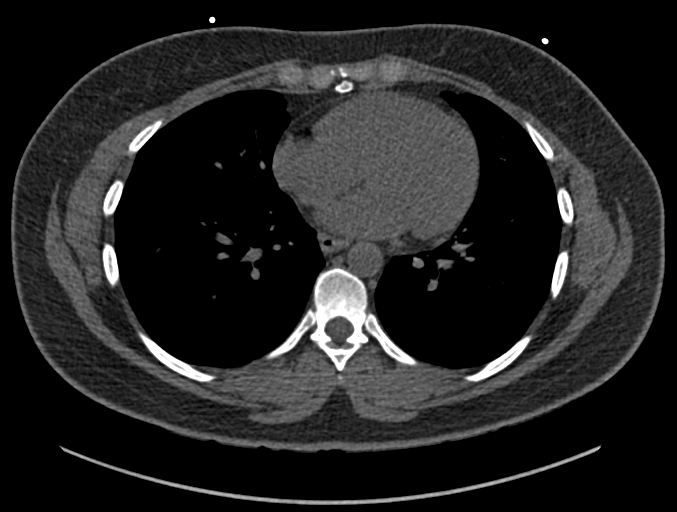
[im 40/60  vessel]
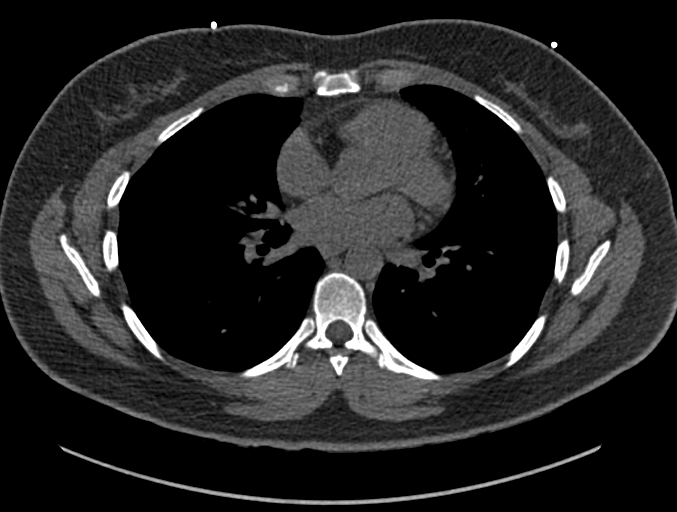
[im 50/60  vessel]
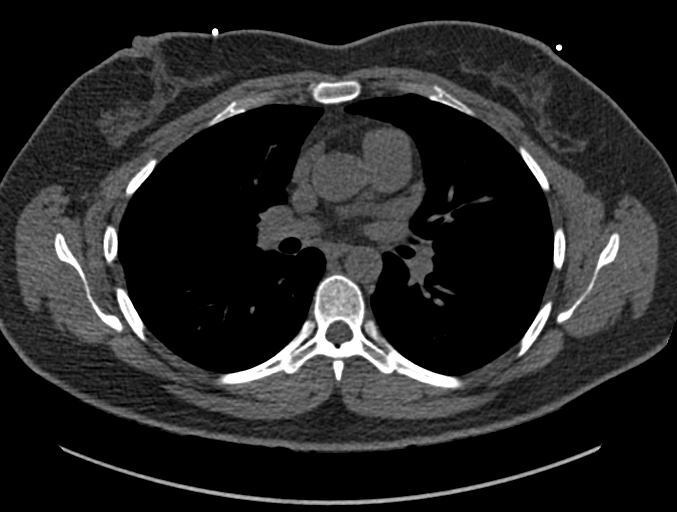
[im 50/60  lung]
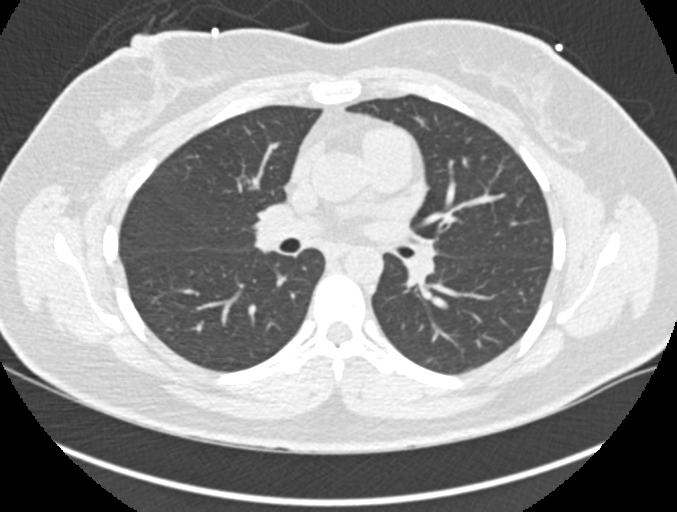

[Series 9: calcium scoring 2.00 br60 bestdiast 70% lungs · axial · 0.53mm/px · z∈[+1637,+1717]mm · 5 of 60 slices shown]
[im 10/60  vessel]
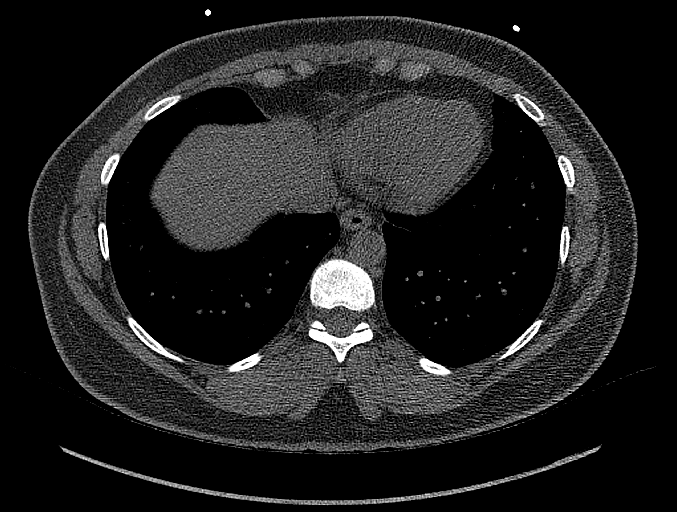
[im 20/60  vessel]
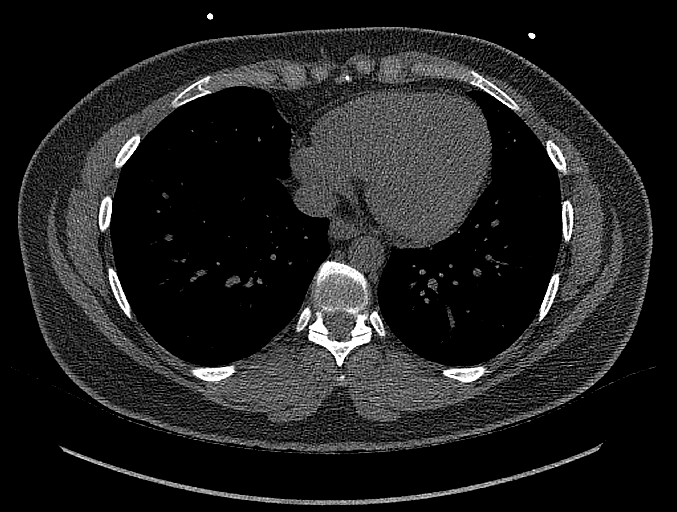
[im 30/60  vessel]
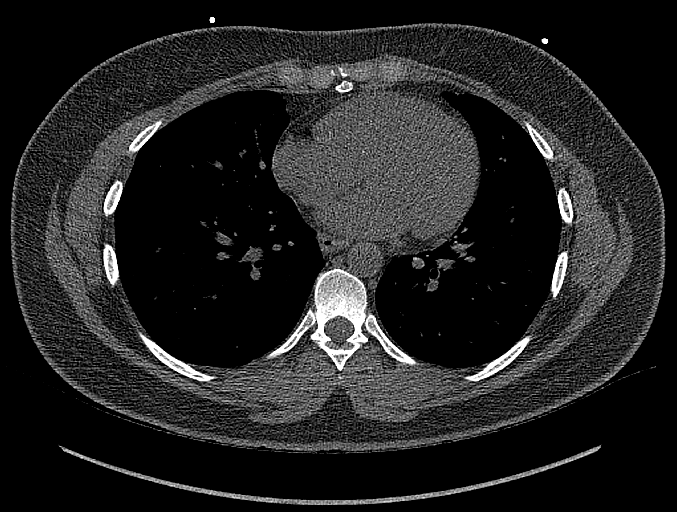
[im 40/60  vessel]
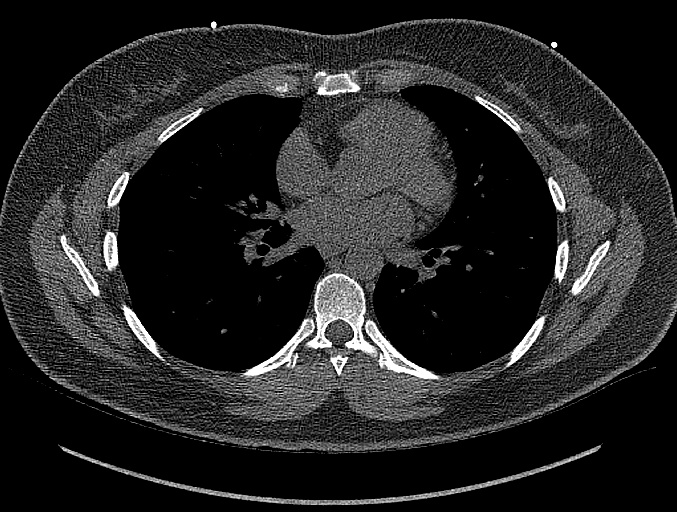
[im 50/60  vessel]
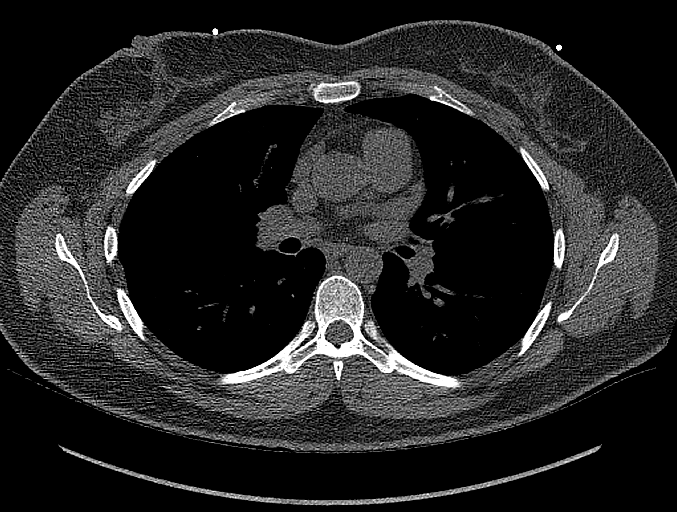

[14 of 20 positions shown; findings below may reference images not displayed]

FINDINGS: CORONARY CALCIUM SCORES:

Left Main: 0

LAD: 0

LCx: 0

RCA: 0

Total Agatston Score: 0

[HOSPITAL] percentile: 0

AORTA MEASUREMENTS:

Ascending Aorta: 29 mm

Descending Aorta: 20 mm

OTHER FINDINGS:

Single right middle lobe subpleural solid pulmonary nodule, Pletal
in shape measuring up to 8 mm, favored represent a normal
intrapulmonary lymph node. No acute or significant abnormality
visualized in the thorax or upper abdomen.
IMPRESSION: No evidence of coronary atherosclerotic disease. The observed
calcium score of 0 is at the zeroth percentile for subjects of the
same age, gender, race/ethnicity who are free of clinical
cardiovascular disease and treated diabetes.
